# Patient Record
Sex: Female | Born: 1975 | Race: White | Hispanic: No | Marital: Married | Smoking: Never smoker
Health system: Southern US, Community
[De-identification: ages and names within clinical notes are randomized; demographics above are authoritative.]

## PROBLEM LIST (undated history)

## (undated) DIAGNOSIS — F419 Anxiety disorder, unspecified: Secondary | ICD-10-CM

## (undated) DIAGNOSIS — R55 Syncope and collapse: Secondary | ICD-10-CM

## (undated) HISTORY — PX: APPENDECTOMY: SHX54

## (undated) HISTORY — DX: Syncope and collapse: R55

## (undated) HISTORY — DX: Anxiety disorder, unspecified: F41.9

---
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
## 1998-08-05 ENCOUNTER — Other Ambulatory Visit: Admission: RE | Admit: 1998-08-05 | Discharge: 1998-08-05 | Payer: Self-pay | Admitting: Gynecology

## 1998-08-06 ENCOUNTER — Ambulatory Visit (HOSPITAL_COMMUNITY): Admission: RE | Admit: 1998-08-06 | Discharge: 1998-08-06 | Payer: Self-pay | Admitting: Dermatology

## 1998-11-22 ENCOUNTER — Other Ambulatory Visit: Admission: RE | Admit: 1998-11-22 | Discharge: 1998-11-22 | Payer: Self-pay | Admitting: Gynecology

## 1999-01-04 ENCOUNTER — Other Ambulatory Visit: Admission: RE | Admit: 1999-01-04 | Discharge: 1999-01-04 | Payer: Self-pay | Admitting: Gynecology

## 1999-10-03 ENCOUNTER — Other Ambulatory Visit: Admission: RE | Admit: 1999-10-03 | Discharge: 1999-10-03 | Payer: Self-pay | Admitting: Gynecology

## 2000-05-02 ENCOUNTER — Other Ambulatory Visit: Admission: RE | Admit: 2000-05-02 | Discharge: 2000-05-02 | Payer: Self-pay | Admitting: Gynecology

## 2000-05-04 ENCOUNTER — Ambulatory Visit (HOSPITAL_COMMUNITY): Admission: RE | Admit: 2000-05-04 | Discharge: 2000-05-04 | Payer: Self-pay | Admitting: Gastroenterology

## 2000-05-04 ENCOUNTER — Encounter: Payer: Self-pay | Admitting: Gastroenterology

## 2000-06-01 ENCOUNTER — Other Ambulatory Visit: Admission: RE | Admit: 2000-06-01 | Discharge: 2000-06-01 | Payer: Self-pay | Admitting: Gynecology

## 2001-07-09 ENCOUNTER — Other Ambulatory Visit: Admission: RE | Admit: 2001-07-09 | Discharge: 2001-07-09 | Payer: Self-pay | Admitting: Gynecology

## 2002-01-06 ENCOUNTER — Other Ambulatory Visit: Admission: RE | Admit: 2002-01-06 | Discharge: 2002-01-06 | Payer: Self-pay | Admitting: Gynecology

## 2002-07-10 ENCOUNTER — Other Ambulatory Visit: Admission: RE | Admit: 2002-07-10 | Discharge: 2002-07-10 | Payer: Self-pay | Admitting: Gynecology

## 2003-02-26 ENCOUNTER — Other Ambulatory Visit: Admission: RE | Admit: 2003-02-26 | Discharge: 2003-02-26 | Payer: Self-pay | Admitting: Gynecology

## 2003-06-10 ENCOUNTER — Ambulatory Visit (HOSPITAL_COMMUNITY): Admission: RE | Admit: 2003-06-10 | Discharge: 2003-06-10 | Payer: Self-pay | Admitting: Orthopedic Surgery

## 2003-06-10 ENCOUNTER — Encounter: Payer: Self-pay | Admitting: Orthopedic Surgery

## 2003-10-16 ENCOUNTER — Other Ambulatory Visit: Admission: RE | Admit: 2003-10-16 | Discharge: 2003-10-16 | Payer: Self-pay | Admitting: Gynecology

## 2004-12-16 ENCOUNTER — Other Ambulatory Visit: Admission: RE | Admit: 2004-12-16 | Discharge: 2004-12-16 | Payer: Self-pay | Admitting: Gynecology

## 2005-06-27 ENCOUNTER — Other Ambulatory Visit: Admission: RE | Admit: 2005-06-27 | Discharge: 2005-06-27 | Payer: Self-pay | Admitting: Gynecology

## 2005-12-26 ENCOUNTER — Other Ambulatory Visit: Admission: RE | Admit: 2005-12-26 | Discharge: 2005-12-26 | Payer: Self-pay | Admitting: Gynecology

## 2006-07-17 ENCOUNTER — Other Ambulatory Visit: Admission: RE | Admit: 2006-07-17 | Discharge: 2006-07-17 | Payer: Self-pay | Admitting: Gynecology

## 2007-01-01 ENCOUNTER — Other Ambulatory Visit: Admission: RE | Admit: 2007-01-01 | Discharge: 2007-01-01 | Payer: Self-pay | Admitting: Gynecology

## 2008-08-21 LAB — HM COLONOSCOPY: HM Colonoscopy: NORMAL

## 2009-08-21 LAB — HM MAMMOGRAPHY: HM Mammogram: NORMAL

## 2012-12-19 LAB — HM PAP SMEAR: HM Pap smear: NORMAL

## 2012-12-19 LAB — HM MAMMOGRAPHY: HM Mammogram: NORMAL

## 2013-11-14 ENCOUNTER — Telehealth: Payer: Self-pay

## 2013-11-14 NOTE — Telephone Encounter (Signed)
Left message for call back Identifiable   NEW CLIENT

## 2013-11-17 ENCOUNTER — Ambulatory Visit (INDEPENDENT_AMBULATORY_CARE_PROVIDER_SITE_OTHER): Payer: Commercial Managed Care - PPO | Admitting: Family Medicine

## 2013-11-17 ENCOUNTER — Encounter: Payer: Self-pay | Admitting: Family Medicine

## 2013-11-17 VITALS — BP 102/70 | HR 58 | Temp 98.2°F | Resp 16 | Ht 65.0 in | Wt 133.4 lb

## 2013-11-17 DIAGNOSIS — Z8041 Family history of malignant neoplasm of ovary: Secondary | ICD-10-CM | POA: Insufficient documentation

## 2013-11-17 DIAGNOSIS — F341 Dysthymic disorder: Secondary | ICD-10-CM

## 2013-11-17 DIAGNOSIS — I951 Orthostatic hypotension: Secondary | ICD-10-CM

## 2013-11-17 DIAGNOSIS — Z0000 Encounter for general adult medical examination without abnormal findings: Secondary | ICD-10-CM

## 2013-11-17 DIAGNOSIS — F419 Anxiety disorder, unspecified: Principal | ICD-10-CM

## 2013-11-17 DIAGNOSIS — J309 Allergic rhinitis, unspecified: Secondary | ICD-10-CM

## 2013-11-17 DIAGNOSIS — R61 Generalized hyperhidrosis: Secondary | ICD-10-CM

## 2013-11-17 DIAGNOSIS — F329 Major depressive disorder, single episode, unspecified: Secondary | ICD-10-CM

## 2013-11-17 LAB — LIPID PANEL
Cholesterol: 194 mg/dL (ref 0–200)
HDL: 77.6 mg/dL (ref >39.00)
LDL Cholesterol: 95 mg/dL (ref 0–99)
Total CHOL/HDL Ratio: 3
Triglycerides: 107 mg/dL (ref 0.0–149.0)
VLDL: 21.4 mg/dL (ref 0.0–40.0)

## 2013-11-17 LAB — HEPATIC FUNCTION PANEL
ALT: 19 U/L (ref 0–35)
AST: 20 U/L (ref 0–37)
Albumin: 3.9 g/dL (ref 3.5–5.2)
Alkaline Phosphatase: 42 U/L (ref 39–117)
BILIRUBIN TOTAL: 0.5 mg/dL (ref 0.3–1.2)
Bilirubin, Direct: 0.1 mg/dL (ref 0.0–0.3)
Total Protein: 7 g/dL (ref 6.0–8.3)

## 2013-11-17 LAB — BASIC METABOLIC PANEL
BUN: 13 mg/dL (ref 6–23)
CO2: 30 mEq/L (ref 19–32)
Calcium: 9.3 mg/dL (ref 8.4–10.5)
Chloride: 99 mEq/L (ref 96–112)
Creatinine, Ser: 0.9 mg/dL (ref 0.4–1.2)
GFR: 80.22 mL/min (ref >60.00)
Glucose, Bld: 84 mg/dL (ref 70–99)
Potassium: 3.9 mEq/L (ref 3.5–5.1)
Sodium: 135 mEq/L (ref 135–145)

## 2013-11-17 NOTE — Assessment & Plan Note (Signed)
New to provider.  Pt getting yearly paps w/ GYN.  Has had initial mammo.  Will follow closely.

## 2013-11-17 NOTE — Assessment & Plan Note (Signed)
New.  Pt's PE confirms her reported sxs.  Start OTC claritin or zyrtec daily.  Reviewed supportive care and red flags that should prompt return.  Pt expressed understanding and is in agreement w/ plan.

## 2013-11-17 NOTE — Assessment & Plan Note (Signed)
New.  Pt w/ long hx of similar.  Encouraged increased fluid intake and liberalization of salt.  Pt to try and focus on changing positions slowly- when possible.  Pt expressed understanding and is in agreement w/ plan.

## 2013-11-17 NOTE — Progress Notes (Signed)
   Subjective:    Patient ID: Kelly Stanley, female    DOB: 1976/02/05, 38 y.o.   MRN: [id number]  HPI New to establish.  Previous MD- none.  GYN- [facility], [facility]  Depression/Anxiety- chronic problem, on Zoloft.  sxs well controlled on $RemoveBefor'50mg'voFhRrvMVaKv$  daily.  Still having intermittent anxiety attacks.  Pt not interested in increasing dose.  FHx ovarian- mom, tested negative for BRCA.  UTD w/ yearly paps.  No family hx of breast cancer.  Allergic rhinitis- pt denies hx of seasonal allergies but recently relocated to area.  + nasal congestion, drainage.  Excessive sweating- pt reports excessive sweating, particularly at night.  Able to soak through 3 pairs of pajamas.  Has had similar 'for years'.  No relief from OCPs.    Orthostatic hypotension- pt has hx of low HR and low BP.  Reports that she will frequently get dizzy.  Particularly w/ position changes or exercise.  Has hx of fainting but not recently.  No nausea, vertigo.   Review of Systems For ROS see HPI     Objective:   Physical Exam  Vitals reviewed. Constitutional: She is oriented to person, place, and time. She appears well-developed and well-nourished. No distress.  HENT:  Head: Normocephalic and atraumatic.  Right Ear: Tympanic membrane normal.  Left Ear: Tympanic membrane normal.  Nose: Mucosal edema and rhinorrhea present. Right sinus exhibits no maxillary sinus tenderness and no frontal sinus tenderness. Left sinus exhibits no maxillary sinus tenderness and no frontal sinus tenderness.  Mouth/Throat: Mucous membranes are normal. Posterior oropharyngeal erythema (w/ PND) present.  Eyes: Conjunctivae and EOM are normal. Pupils are equal, round, and reactive to light.  Neck: Normal range of motion. Neck supple. No thyromegaly present.  Cardiovascular: Normal rate, regular rhythm and normal heart sounds.   Pulmonary/Chest: Effort normal and breath sounds normal. No respiratory distress. She has no wheezes. She  has no rales.  Musculoskeletal: She exhibits no edema.  Lymphadenopathy:    She has no cervical adenopathy.  Neurological: She is alert and oriented to person, place, and time.  Skin: Skin is warm and dry.  Psychiatric: She has a normal mood and affect. Her behavior is normal.          Assessment & Plan:

## 2013-11-17 NOTE — Patient Instructions (Signed)
Follow up in 1 year or as needed Keep up the good work!  You look great! We'll notify you of your lab results and make any changes if needed Start Claritin or Zyrtec daily- plain, NOT the D! Drink plenty of fluids, increase your salt intake, and change positions slowly to avoid dizziness Make sure you mention the night sweats to Dr Phineas Real  Call with any questions or concerns Welcome!  We're glad to have you!

## 2013-11-17 NOTE — Assessment & Plan Note (Signed)
New to provider, ongoing for pt.  Doing well on Zoloft.  Not interested in increasing dose at this time.  Will continue to follow.

## 2013-11-17 NOTE — Progress Notes (Signed)
Pre visit review using our clinic review tool, if applicable. No additional management support is needed unless otherwise documented below in the visit note. 

## 2013-11-17 NOTE — Assessment & Plan Note (Signed)
New to provider, pt reports this has been ongoing since HS.  Check labs to r/o infection, thyroid abnormality.  No relief w/ OCPs.  Recommended that pt also discuss this w/ GYN.  Will follow.

## 2013-11-18 LAB — CBC WITH DIFFERENTIAL/PLATELET
BASOS PCT: 0.5 % (ref 0.0–3.0)
Basophils Absolute: 0 10*3/uL (ref 0.0–0.1)
EOS ABS: 0.2 10*3/uL (ref 0.0–0.7)
EOS PCT: 2.6 % (ref 0.0–5.0)
HEMATOCRIT: 41.8 % (ref 36.0–46.0)
Hemoglobin: 14.1 g/dL (ref 12.0–15.0)
LYMPHS ABS: 2.8 10*3/uL (ref 0.7–4.0)
Lymphocytes Relative: 39.9 % (ref 12.0–46.0)
MCHC: 33.6 g/dL (ref 30.0–36.0)
MCV: 93.7 fl (ref 78.0–100.0)
Monocytes Absolute: 0.4 10*3/uL (ref 0.1–1.0)
Monocytes Relative: 5.6 % (ref 3.0–12.0)
NEUTROS ABS: 3.6 10*3/uL (ref 1.4–7.7)
Neutrophils Relative %: 51.4 % (ref 43.0–77.0)
Platelets: 225 10*3/uL (ref 150.0–400.0)
RBC: 4.47 Mil/uL (ref 3.87–5.11)
RDW: 12.3 % (ref 11.5–14.6)
WBC: 6.9 10*3/uL (ref 4.5–10.5)

## 2013-11-19 ENCOUNTER — Encounter: Payer: Self-pay | Admitting: General Practice

## 2013-11-19 NOTE — Telephone Encounter (Signed)
Unable to reach pre visit.  

## 2013-11-22 LAB — VITAMIN D 1,25 DIHYDROXY
VITAMIN D3 1, 25 (OH): 62 pg/mL
Vitamin D 1, 25 (OH)2 Total: 62 pg/mL (ref 18–72)
Vitamin D2 1, 25 (OH)2: <8 pg/mL

## 2013-11-24 ENCOUNTER — Encounter: Payer: Self-pay | Admitting: General Practice

## 2013-12-24 ENCOUNTER — Ambulatory Visit (INDEPENDENT_AMBULATORY_CARE_PROVIDER_SITE_OTHER): Payer: Commercial Managed Care - PPO | Admitting: Gynecology

## 2013-12-24 ENCOUNTER — Encounter: Payer: Self-pay | Admitting: Gynecology

## 2013-12-24 ENCOUNTER — Other Ambulatory Visit (HOSPITAL_COMMUNITY)
Admission: RE | Admit: 2013-12-24 | Discharge: 2013-12-24 | Disposition: A | Discharge status: Home / Self Care | Payer: Commercial Managed Care - PPO | Source: Ambulatory Visit | Attending: Gynecology | Admitting: Gynecology

## 2013-12-24 VITALS — BP 120/70 | Ht 65.0 in | Wt 133.0 lb

## 2013-12-24 DIAGNOSIS — Z01419 Encounter for gynecological examination (general) (routine) without abnormal findings: Secondary | ICD-10-CM

## 2013-12-24 DIAGNOSIS — Z309 Encounter for contraceptive management, unspecified: Secondary | ICD-10-CM

## 2013-12-24 DIAGNOSIS — Z1151 Encounter for screening for human papillomavirus (HPV): Secondary | ICD-10-CM | POA: Insufficient documentation

## 2013-12-24 MED ORDER — DESOGESTREL-ETHINYL ESTRADIOL 0.15-0.02/0.01 MG (21/5) PO TABS: 1.0000 | ORAL_TABLET | Freq: Every day | ORAL | Status: DC

## 2013-12-24 NOTE — Patient Instructions (Signed)
Continue on the birth control pills as we discussed. Followup if any issues otherwise in one year.

## 2013-12-24 NOTE — Addendum Note (Signed)
Addended by: Nelva Nay on: 12/24/2013 10:53 AM   Modules accepted: Orders

## 2013-12-24 NOTE — Progress Notes (Signed)
Kelly Stanley [birth year] [id number]        38 y.o.  [id number] for annual exam.  Prior patient to moved out of the area and now returns. No problems. Several issues reviewed below.  Past medical history,surgical history, problem list, medications, allergies, family history and social history were all reviewed and documented as reviewed in the EPIC chart.  ROS:  12 system ROS performed with pertinent positives and negatives included in the history, assessment and plan.  Included Systems: General, HEENT, Neck, Cardiovascular, Pulmonary, Gastrointestinal, Genitourinary, Musculoskeletal, Dermatologic, Endocrine, Hematological, Neurologic, Psychiatric Additional significant findings :  None   Exam: Kim assistant Filed Vitals:   12/24/13 0957  BP: 120/70  Height: _0  (1.651 m)  Weight: 133 lb (60.328 kg)   General appearance:  Normal affect, orientation and appearance. Skin: Grossly normal HEENT: Without gross lesions.  No cervical or supraclavicular adenopathy. Thyroid normal.  Lungs:  Clear without wheezing, rales or rhonchi Cardiac: RR, without RMG Abdominal:  Soft, nontender, without masses, guarding, rebound, organomegaly or hernia Breasts:  Examined lying and sitting without masses, retractions, discharge or axillary adenopathy. Pelvic:  Ext/BUS/vagina normal  Cervix normal. Pap/HPV  Uterus anteverted, normal size, shape and contour, midline and mobile nontender   Adnexa  Without masses or tenderness    Anus and perineum  Normal   Rectovaginal  Normal sphincter tone without palpated masses or tenderness.    Assessment/Plan:  38 y.o. [name] female for annual exam with regular menses, oral contraceptives.   1. Contraceptive management. Patient on desogestrel low dose pill. Doing well. Reviewed risks of birth control pills to include increased risk of stroke heart attack DVT. Possible slight increased risk with desogestrel. Maternal history of ovarian cancer. Benefits of risk  reduction continuing on the oral contraceptives discussed. Patient wants to continue for now and I refilled her x1 year. 2. 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 Passed away last year. Was tested and negative for BRCA genes. No other family members with ovarian cancer, breast cancer, colon or uterine cancer. Patient had previously been doing ultrasounds and CA 125's. Had stopped doing these at her other gynecologist recognizing questionable benefit. Patient not interested in pursuing at this time. 3. Anxiety. Patient has been on Zoloft 50 mg by her other physician for some anxiety. She does well with this and wants continued. She has refills at home but she will call when she needs refills and I feel comfortable with this. 4. Pap smear 2014. Pap/HPV today. History of LGSIL changes 2005 through 2007. Colposcopy was normal at that time. Negative Pap smears since then. Options for less frequent screening intervals reviewed and will readdress on an annual basis. 5. Mammography. Patient did have mammogram 2014 secondary to questionable palpable area historically. Mammogram was normal. Exam today is normal patient has no self-reported abnormalities. Will repeat mammogram at age 22. SBE monthly review. 6. Health maintenance. Patient recently had a full blood work through Dr. Birdie Riddle office. Followup in one year, sooner as needed.   Note: This document was prepared with digital dictation and possible smart phrase technology. Any transcriptional errors that result from this process are unintentional.   [name] MD, 10:42 AM 12/24/2013

## 2013-12-26 ENCOUNTER — Encounter: Payer: Self-pay | Admitting: Gynecology

## 2014-01-06 ENCOUNTER — Ambulatory Visit: Payer: Commercial Managed Care - PPO | Admitting: Internal Medicine

## 2014-01-13 ENCOUNTER — Ambulatory Visit (INDEPENDENT_AMBULATORY_CARE_PROVIDER_SITE_OTHER): Payer: Commercial Managed Care - PPO | Admitting: Internal Medicine

## 2014-01-13 ENCOUNTER — Encounter: Payer: Self-pay | Admitting: Internal Medicine

## 2014-01-13 VITALS — BP 95/57 | HR 71 | Temp 97.8°F | Wt 132.0 lb

## 2014-01-13 DIAGNOSIS — L259 Unspecified contact dermatitis, unspecified cause: Secondary | ICD-10-CM

## 2014-01-13 MED ORDER — PREDNISONE 10 MG PO TABS: ORAL_TABLET | ORAL | Status: DC

## 2014-01-13 MED ORDER — TRIAMCINOLONE ACETONIDE 0.1 % EX LOTN: 1.0000 "application " | TOPICAL_LOTION | Freq: Three times a day (TID) | CUTANEOUS | Status: DC

## 2014-01-13 NOTE — Patient Instructions (Signed)
Take prednisone as prescribed for few days Use the lotion twice or 3 times a day in the chest and arms Claritin 10 mg OTC one tablet daily until better If not improving or you get worse let us know

## 2014-01-13 NOTE — Progress Notes (Signed)
   Subjective:    Patient ID: Kelly Stanley, female    DOB: 20-May-1976, 38 y.o.   MRN: [id number]  DOS:  01/13/2014 Type of  Visit: acute History: About 10 days ago she did yard work, shortly after developed a rash in the legs, it was very itchy, went to urgent care and was prescribed a cortisone cream which is not helping much. Also, 3 days ago developed a different rash in the upper chest and inner arms.   ROS Denies fever chills No joint aches. Occasional headaches, mild, nothing new to her  Past Medical History  Diagnosis Date  . Syncope     Past Surgical History  Procedure Laterality Date  . Knee arthroscopy w/ lateral release Left 1997  . Appendectomy      History   Social History  . Marital Status: Married    Spouse Name: N/A    Number of Children: N/A  . Years of Education: N/A   Occupational History  . Not on file.   Social History Main Topics  . Smoking status: Never Smoker   . Smokeless tobacco: Never Used  . Alcohol Use: Yes     Comment: Occas  . Drug Use: No  . Sexual Activity: Yes    Birth Control/ Protection: Pill   Other Topics Concern  . Not on file   Social History Narrative  . No narrative on file        Medication List       This list is accurate as of: 01/13/14  5:35 PM.  Always use your most recent med list.               desogestrel-ethinyl estradiol 0.15-0.02/0.01 MG (21/5) tablet  Commonly known as:  AZURETTE  Take 1 tablet by mouth daily.     predniSONE 10 MG tablet  Commonly known as:  DELTASONE  4 tablets x 2 days, 3 tabs x 2 days, 2 tabs x 2 days, 1 tab x 2 days     sertraline 50 MG tablet  Commonly known as:  ZOLOFT  Take 50 mg by mouth daily.     triamcinolone lotion 0.1 %  Commonly known as:  KENALOG  Apply 1 application topically 3 (three) times daily.           Objective:   Physical Exam BP 95/57  Pulse 71  Temp(Src) 97.8 F (36.6 C)  Wt 132 lb (59.875 kg)  SpO2 100%  LMP 12/10/2013 General --  alert, well-developed, NAD.   Extremities-- no pretibial edema bilaterally ;   skin lesions several days old, some in a linear disposition. She also has a rash on the upper chest and inner arms bilaterally, the rash looks different: multiple 2-3 mm papules, slightly red, non-confluent. Neurologic--  alert & oriented X3. Speech normal, gait normal, strength normal in all extremities.  Psych-- Cognition and judgment appear intact. Cooperative with normal attention span and concentration. No anxious or depressed appearing.        Assessment & Plan:   Contact dermatitis, Rash on the extremities quite consistent with contact dermatitis but I'm not sure about a rash at the chest and arms. She has no systemic symptoms. Plan: Prednisone, Claritin, Kenalog lotion. Information about contact dermatitis provided, recommend to wash all her clothing and watch for pet going  to the yard. Call if no better

## 2014-01-13 NOTE — Progress Notes (Signed)
Pre visit review using our clinic review tool, if applicable. No additional management support is needed unless otherwise documented below in the visit note. 

## 2014-01-26 ENCOUNTER — Encounter: Payer: Self-pay | Admitting: Physician Assistant

## 2014-01-26 ENCOUNTER — Ambulatory Visit (INDEPENDENT_AMBULATORY_CARE_PROVIDER_SITE_OTHER): Payer: Commercial Managed Care - PPO | Admitting: Physician Assistant

## 2014-01-26 VITALS — BP 124/72 | HR 73 | Temp 98.7°F | Resp 16 | Ht 65.0 in | Wt 134.2 lb

## 2014-01-26 DIAGNOSIS — R42 Dizziness and giddiness: Secondary | ICD-10-CM

## 2014-01-26 DIAGNOSIS — R141 Gas pain: Secondary | ICD-10-CM

## 2014-01-26 DIAGNOSIS — L239 Allergic contact dermatitis, unspecified cause: Secondary | ICD-10-CM

## 2014-01-26 DIAGNOSIS — R140 Abdominal distension (gaseous): Secondary | ICD-10-CM

## 2014-01-26 DIAGNOSIS — R143 Flatulence: Secondary | ICD-10-CM

## 2014-01-26 DIAGNOSIS — R142 Eructation: Secondary | ICD-10-CM

## 2014-01-26 DIAGNOSIS — L259 Unspecified contact dermatitis, unspecified cause: Secondary | ICD-10-CM

## 2014-01-26 LAB — POCT URINE PREGNANCY: Preg Test, Ur: NEGATIVE

## 2014-01-26 MED ORDER — SERTRALINE HCL 50 MG PO TABS: 50.0000 mg | ORAL_TABLET | Freq: Every day | ORAL | Status: DC

## 2014-01-26 MED ORDER — PREDNISONE 10 MG PO TABS: ORAL_TABLET | ORAL | Status: DC

## 2014-01-26 NOTE — Assessment & Plan Note (Signed)
Feel lightheadedness is a manifestation of increased anxiety from constant itch and scratching.  OVS were negative. Zoloft refilled as patient has been out of medication.

## 2014-01-26 NOTE — Progress Notes (Signed)
Pre visit review using our clinic review tool, if applicable. No additional management support is needed unless otherwise documented below in the visit note/SLS  

## 2014-01-26 NOTE — Patient Instructions (Addendum)
Please take prednisone as directed -- 4 tablets by mouth for 3 days, 3 tablets by mouth for 3 days, 2 tablets by mouth for 3 days, then 1 tablet for 3 days.  Apply Sarna lotion for itch.  Continue claritin.  Cool compresses will be beneficial.  Avoid sunlight and heat as this will worsen rash.  Stop using soaps/lotions/detergents that are dyed or heavily scented. Please call if symptoms are not improving. For lightheadedness, I think this is being exacerbated by anxiety/frustration from your itching.  Try to continue staying well-hydrated.  Add 1 gatorade per day.  Continue healthy snacking.  If symptoms persist despite resolution of itchy rash, please let us know.

## 2014-01-26 NOTE — Progress Notes (Signed)
Patient presents to clinic today c/o 3 weeks of a pruritic rash of her chest, torso and bilateral upper extremities. Patient also has a different rash 4 weeks ago that was diagnosed as rhus dermatitis.  Patient given 8-day taper of steroids.  States poison ivy rash completely resolved.  The newer rash in question started resolving and was almost completely resolved while on steroid.  After cessation of steroid, rash came back and is worse.  Patient denies change in detergent, lotion or other hygiene products.  Does have pets in the home.  Denies hx of metal allergy.  Denies SOB, wheezing or facial swelling.  Does have a history of PUPS with previous pregnancy.  LMP 3 weeks ago.  Patient requesting pregnancy test.  Patient also endorses continued lightheadedness.  Denies syncope.  Patient with documented orthostatic hypotension.  Endorses pretty good fluid intake, but states she "could do much better".  Recent labs are negative for anemia.  Past Medical History  Diagnosis Date  . Syncope     Current Outpatient Prescriptions on File Prior to Visit  Medication Sig Dispense Refill  . desogestrel-ethinyl estradiol (AZURETTE) 0.15-0.02/0.01 MG (21/5) tablet Take 1 tablet by mouth daily.  1 Package  12  . triamcinolone lotion (KENALOG) 0.1 % Apply 1 application topically 3 (three) times daily.  60 mL  0   No current facility-administered medications on file prior to visit.    Allergies  Allergen Reactions  . Codeine Nausea And Vomiting    Family History  Problem Relation Age of Onset  . Cancer Mother     ovarian  . Stroke Maternal Grandfather   . Diabetes Maternal Grandfather     History   Social History  . Marital Status: Married    Spouse Name: N/A    Number of Children: N/A  . Years of Education: N/A   Social History Main Topics  . Smoking status: Never Smoker   . Smokeless tobacco: Never Used  . Alcohol Use: Yes     Comment: Occas  . Drug Use: No  . Sexual Activity: Yes   Birth Control/ Protection: Pill   Other Topics Concern  . None   Social History Narrative  . None   Review of Systems - See HPI.  All other ROS are negative.  BP 124/72  Pulse 73  Temp(Src) 98.7 F (37.1 C) (Oral)  Resp 16  Ht 5\' 5"  (1.651 m)  Wt 134 lb 4 oz (60.895 kg)  BMI 22.34 kg/m2  SpO2 98%  LMP 01/02/2014  Physical Exam  Vitals reviewed. Constitutional: She is oriented to person, place, and time and well-developed, well-nourished, and in no distress.  HENT:  Head: Normocephalic and atraumatic.  Eyes: Conjunctivae are normal. Pupils are equal, round, and reactive to light.  Neck: Neck supple.  Cardiovascular: Normal rate, regular rhythm, normal heart sounds and intact distal pulses.   Pulmonary/Chest: Effort normal and breath sounds normal. No respiratory distress. She has no wheezes. She has no rales. She exhibits no tenderness.  Neurological: She is alert and oriented to person, place, and time.  Skin: Skin is warm and dry.  Scattered macular rash of torso and bilateral upper extremities.  Psychiatric: Affect normal.   Recent Results (from the past 2160 hour(s))  LIPID PANEL     Status: None   Collection Time    11/17/13  9:17 AM      Result Value Ref Range   Cholesterol 194  0 - 200 mg/dL   Comment: ATP  III Classification       Desirable:  < 200 mg/dL               Borderline High:  200 - 239 mg/dL          High:  > = 240 mg/dL   Triglycerides 107.0  0.0 - 149.0 mg/dL   Comment: Normal:  <150 mg/dLBorderline High:  150 - 199 mg/dL   HDL 77.60  >39.00 mg/dL   VLDL 21.4  0.0 - 40.0 mg/dL   LDL Cholesterol 95  0 - 99 mg/dL   Total CHOL/HDL Ratio 3     Comment:                Men          Women1/2 Average Risk     3.4          3.3Average Risk          5.0          4.42X Average Risk          9.6          7.13X Average Risk          15.0          11.0                      BASIC METABOLIC PANEL     Status: None   Collection Time    11/17/13  9:17 AM      Result  Value Ref Range   Sodium 135  135 - 145 mEq/L   Potassium 3.9  3.5 - 5.1 mEq/L   Chloride 99  96 - 112 mEq/L   CO2 30  19 - 32 mEq/L   Glucose, Bld 84  70 - 99 mg/dL   BUN 13  6 - 23 mg/dL   Creatinine, Ser 0.9  0.4 - 1.2 mg/dL   Calcium 9.3  8.4 - 10.5 mg/dL   GFR 80.22  >60.00 mL/min  HEPATIC FUNCTION PANEL     Status: None   Collection Time    11/17/13  9:17 AM      Result Value Ref Range   Total Bilirubin 0.5  0.3 - 1.2 mg/dL   Bilirubin, Direct 0.1  0.0 - 0.3 mg/dL   Alkaline Phosphatase 42  39 - 117 U/L   AST 20  0 - 37 U/L   ALT 19  0 - 35 U/L   Total Protein 7.0  6.0 - 8.3 g/dL   Albumin 3.9  3.5 - 5.2 g/dL  CBC WITH DIFFERENTIAL     Status: None   Collection Time    11/17/13  9:17 AM      Result Value Ref Range   WBC 6.9  4.5 - 10.5 K/uL   RBC 4.47  3.87 - 5.11 Mil/uL   Hemoglobin 14.1  12.0 - 15.0 g/dL   HCT 41.8  36.0 - 46.0 %   MCV 93.7  78.0 - 100.0 fl   MCHC 33.6  30.0 - 36.0 g/dL   RDW 12.3  11.5 - 14.6 %   Platelets 225.0  150.0 - 400.0 K/uL   Neutrophils Relative % 51.4  43.0 - 77.0 %   Lymphocytes Relative 39.9  12.0 - 46.0 %   Monocytes Relative 5.6  3.0 - 12.0 %   Eosinophils Relative 2.6  0.0 - 5.0 %   Basophils Relative 0.5  0.0 - 3.0 %  Neutro Abs 3.6  1.4 - 7.7 K/uL   Lymphs Abs 2.8  0.7 - 4.0 K/uL   Monocytes Absolute 0.4  0.1 - 1.0 K/uL   Eosinophils Absolute 0.2  0.0 - 0.7 K/uL   Basophils Absolute 0.0  0.0 - 0.1 K/uL  VITAMIN D 1,25 DIHYDROXY     Status: None   Collection Time    11/17/13  9:17 AM      Result Value Ref Range   Vitamin D 1, 25 (OH)2 Total 62  18 - 72 pg/mL   Vitamin D3 1, 25 (OH)2 62     Vitamin D2 1, 25 (OH)2 <8     Comment: Vitamin D3, 1,25(OH)2 indicates both endogenous     production and supplementation.  Vitamin D2, 1,25(OH)2     is an indicator of exogeous sources, such as diet or     supplementation.  Interpretation and therapy are based     on measurement of Vitamin D,1,25(OH)2, Total.     This test was  developed and its performance     characteristics have been determined by [facility], [place], [place].     Performance characteristics refer to the     analytical performance of the test.  POCT URINE PREGNANCY     Status: None   Collection Time    01/26/14  3:09 PM      Result Value Ref Range   Preg Test, Ur Negative     Assessment/Plan: Allergic contact dermatitis + Dermatographism. Urine pregnancy negative so no PUPS.Likely rebound rash from too short of steroid course. Rx 12-day prednisone taper.  Sarna lotion.  Continue claritin.  Cool compresses.  Feel lightheadedness is a manifestation of increased anxiety from constant itch and scratching.    Lightheaded Feel lightheadedness is a manifestation of increased anxiety from constant itch and scratching.  OVS were negative. Zoloft refilled as patient has been out of medication.

## 2014-01-26 NOTE — Assessment & Plan Note (Addendum)
+   Dermatographism. Urine pregnancy negative so no PUPS.Likely rebound rash from too short of steroid course. Rx 12-day prednisone taper.  Sarna lotion.  Continue claritin.  Cool compresses.  Feel lightheadedness is a manifestation of increased anxiety from constant itch and scratching.

## 2014-04-09 ENCOUNTER — Other Ambulatory Visit: Payer: Self-pay | Admitting: Family Medicine

## 2014-04-09 NOTE — Telephone Encounter (Signed)
Med filled.  

## 2014-05-13 ENCOUNTER — Ambulatory Visit (INDEPENDENT_AMBULATORY_CARE_PROVIDER_SITE_OTHER): Payer: Commercial Managed Care - PPO | Admitting: Family Medicine

## 2014-05-13 ENCOUNTER — Encounter: Payer: Self-pay | Admitting: Family Medicine

## 2014-05-13 VITALS — BP 120/78 | HR 53 | Temp 98.0°F | Resp 16 | Wt 134.0 lb

## 2014-05-13 DIAGNOSIS — R21 Rash and other nonspecific skin eruption: Secondary | ICD-10-CM | POA: Insufficient documentation

## 2014-05-13 MED ORDER — PREDNISONE 10 MG PO TABS: ORAL_TABLET | ORAL | Status: DC

## 2014-05-13 MED ORDER — PERMETHRIN 5 % EX CREA: 1.0000 "application " | TOPICAL_CREAM | Freq: Once | CUTANEOUS | Status: DC

## 2014-05-13 NOTE — Progress Notes (Signed)
Pre visit review using our clinic review tool, if applicable. No additional management support is needed unless otherwise documented below in the visit note. 

## 2014-05-13 NOTE — Progress Notes (Signed)
   Subjective:    Patient ID: Kelly Stanley, female    DOB: September 14, 1975, 38 y.o.   MRN: [id number]  HPI Rash- sxs started 1 week ago.  No one at home w/ itchy rash, including husband who has no rash.  Around kids and pets regularly.  sxs started on R inner thigh and spread to L thigh and L arm.  Nothing on hands or feet.  Very itchy.  Attempted to use Triamcinolone lotion w/ minimal relief.  Some improvement w/ Benadryl gel.   Review of Systems For ROS see HPI     Objective:   Physical Exam  Vitals reviewed. Constitutional: She is oriented to person, place, and time. She appears well-developed and well-nourished. No distress.  Neurological: She is alert and oriented to person, place, and time.  Skin: Skin is warm and dry. Rash (pt w/ scabbed vesicular rash on bilateral inner thighs and L forearm w/ some apparant burrow formations) noted.          Assessment & Plan:

## 2014-05-13 NOTE — Patient Instructions (Signed)
Follow up as needed Apply the Elimite cream tonight from jaw down, covering all exposed areas, sleep in it, wash it off in AM and then wash all sheets and towels Call with any questions or concerns- particularly if not improving Hang in there!

## 2014-05-13 NOTE — Assessment & Plan Note (Signed)
New.  Pt's rash is almost a cross between contact dermatitis and scabies.  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  Start Pred taper to cover for possible contact dermatitis and to improve itching.  Reviewed supportive care and red flags that should prompt return.  Pt expressed understanding and is in agreement w/ plan.

## 2014-06-03 ENCOUNTER — Encounter: Payer: Self-pay | Admitting: Internal Medicine

## 2014-06-03 ENCOUNTER — Ambulatory Visit (INDEPENDENT_AMBULATORY_CARE_PROVIDER_SITE_OTHER): Payer: Commercial Managed Care - PPO | Admitting: Internal Medicine

## 2014-06-03 VITALS — BP 110/72 | HR 48 | Temp 98.4°F | Wt 133.5 lb

## 2014-06-03 DIAGNOSIS — J209 Acute bronchitis, unspecified: Secondary | ICD-10-CM

## 2014-06-03 MED ORDER — AZITHROMYCIN 250 MG PO TABS: ORAL_TABLET | ORAL | Status: DC

## 2014-06-03 NOTE — Patient Instructions (Signed)
Rest, fluids , tylenol If  cough, take Mucinex DM twice a day as needed  If nasal  congestion use OTC Nasocort: 2 nasal sprays on each side of the nose daily until you feel better  Take the antibiotic as prescribed  (zpack); your birth control pills won't be as effective while on antibiotics  Call if not gradually better over the next  10 days Call anytime if the symptoms are severe

## 2014-06-03 NOTE — Progress Notes (Signed)
Pre visit review using our clinic review tool, if applicable. No additional management support is needed unless otherwise documented below in the visit note. 

## 2014-06-03 NOTE — Progress Notes (Signed)
   Subjective:    Patient ID: Kelly Stanley, female    DOB: 06/20/1976, 38 y.o.   MRN: [id number]  DOS:  06/03/2014 Type of visit - description : acute Interval history: Symptoms started 2 weeks ago with increasingly more frequent and intense cough with mild greenish sputum production. She took Mucinex as needed with some help. Has front of headaches, sometimes has a hard time controlling the cough. Other family members are affected as well   ROS Denies actual fever or chills Mild ear ache bilaterally for 2 days, mild sinus pain. Chest pain only with cough No nausea, vomiting, diarrhea  Past Medical History  Diagnosis Date  . Syncope     Past Surgical History  Procedure Laterality Date  . Knee arthroscopy w/ lateral release Left 1997  . Appendectomy      History   Social History  . Marital Status: Married    Spouse Name: N/A    Number of Children: N/A  . Years of Education: N/A   Occupational History  . Not on file.   Social History Main Topics  . Smoking status: Never Smoker   . Smokeless tobacco: Never Used  . Alcohol Use: Yes     Comment: Occas  . Drug Use: No  . Sexual Activity: Yes    Birth Control/ Protection: Pill   Other Topics Concern  . Not on file   Social History Narrative  . No narrative on file        Medication List       This list is accurate as of: 06/03/14  7:04 PM.  Always use your most recent med list.               azithromycin 250 MG tablet  Commonly known as:  ZITHROMAX Z-PAK  2 tabs a day the first day, then 1 tab a day x 4 days     desogestrel-ethinyl estradiol 0.15-0.02/0.01 MG (21/5) tablet  Commonly known as:  AZURETTE  Take 1 tablet by mouth daily.     sertraline 50 MG tablet  Commonly known as:  ZOLOFT  TAKE 1 TABLET BY MOUTH DAILY     triamcinolone lotion 0.1 %  Commonly known as:  KENALOG  Apply 1 application topically 3 (three) times daily.           Objective:   Physical Exam BP 110/72  Pulse  48  Temp(Src) 98.4 F (36.9 C) (Oral)  Wt 133 lb 8 oz (60.555 kg)  SpO2 98%  LMP 05/31/2014  General -- alert, well-developed, NAD.  HEENT-- Not pale.  R and L  Ear--slt bulge TM w/o redness  Throat symmetric, no redness or discharge. Face symmetric, sinuses not tender to palpation. Nose   congested. Lungs -- normal respiratory effort, no intercostal retractions, no accessory muscle use, and ronchi W/ cough only Heart-- normal rate, regular rhythm, no murmur.  Psych-- Cognition and judgment appear intact. Cooperative with normal attention span and concentration. No anxious or depressed appearing.      Assessment & Plan:    Bronchitis-- see instructions

## 2014-06-05 ENCOUNTER — Other Ambulatory Visit: Payer: Self-pay

## 2014-06-22 ENCOUNTER — Encounter: Payer: Self-pay | Admitting: Internal Medicine

## 2014-09-25 ENCOUNTER — Other Ambulatory Visit: Payer: Self-pay | Admitting: Family Medicine

## 2014-09-25 NOTE — Telephone Encounter (Signed)
Last ov 05/13/14 zoloft last filled 04/09/14 #30 with 3

## 2014-10-02 ENCOUNTER — Ambulatory Visit (INDEPENDENT_AMBULATORY_CARE_PROVIDER_SITE_OTHER): Payer: Commercial Managed Care - PPO | Admitting: Physician Assistant

## 2014-10-02 ENCOUNTER — Encounter: Payer: Self-pay | Admitting: Physician Assistant

## 2014-10-02 VITALS — BP 118/68 | HR 75 | Temp 98.4°F | Resp 16 | Ht 65.0 in | Wt 133.4 lb

## 2014-10-02 DIAGNOSIS — H6503 Acute serous otitis media, bilateral: Secondary | ICD-10-CM | POA: Diagnosis not present

## 2014-10-02 DIAGNOSIS — F0781 Postconcussional syndrome: Secondary | ICD-10-CM | POA: Insufficient documentation

## 2014-10-02 DIAGNOSIS — H6500 Acute serous otitis media, unspecified ear: Secondary | ICD-10-CM | POA: Insufficient documentation

## 2014-10-02 DIAGNOSIS — N926 Irregular menstruation, unspecified: Secondary | ICD-10-CM

## 2014-10-02 LAB — POCT URINE PREGNANCY: PREG TEST UR: NEGATIVE

## 2014-10-02 MED ORDER — FLUTICASONE PROPIONATE 50 MCG/ACT NA SUSP: 2.0000 | Freq: Every day | NASAL | Status: DC

## 2014-10-02 NOTE — Addendum Note (Signed)
Addended by: Rockwell Germany on: 10/02/2014 05:17 PM   Modules accepted: Orders

## 2014-10-02 NOTE — Assessment & Plan Note (Signed)
Neuro exam including Concussion assessment unremarkable. Symptoms related to post-concussion syndrome which patient is more prone to giving her history of concussions throughout the years. Supportive measures discussed. Avoid any overexertion.  Sleep will be very beneficial.  Discussed mental rest.  Follow-up in 1 week.

## 2014-10-02 NOTE — Assessment & Plan Note (Signed)
Do think this is contributing to the intermittent vertigo as there is a good amount of fluid present.  Rx Flonase.  Take a daily Claritin.

## 2014-10-02 NOTE — Progress Notes (Signed)
Patient presents to clinic today c/o severe headaches, lightheadedness and nausea after being involved in a head-head collision while playing soccer this past Saturday.  Denies LOC at time of incident.  Symptoms began two days ago, 4 days after the incident.  Denies sinus pressure, sinus pain, ear pressure or URI symptoms.  Endorses prior history of concussion in college.  Endorses lightheadedness is improving, but headaches still present.  Has been resting over the week.  Past Medical History  Diagnosis Date  . Syncope     Current Outpatient Prescriptions on File Prior to Visit  Medication Sig Dispense Refill  . desogestrel-ethinyl estradiol (AZURETTE) 0.15-0.02/0.01 MG (21/5) tablet Take 1 tablet by mouth daily. 1 Package 12  . sertraline (ZOLOFT) 50 MG tablet TAKE 1 TABLET BY MOUTH EVERY DAY 30 tablet 6   No current facility-administered medications on file prior to visit.    Allergies  Allergen Reactions  . Codeine Nausea And Vomiting    Family History  Problem Relation Age of Onset  . Cancer Mother     ovarian  . Stroke Maternal Grandfather   . Diabetes Maternal Grandfather     History   Social History  . Marital Status: Married    Spouse Name: N/A  . Number of Children: N/A  . Years of Education: N/A   Social History Main Topics  . Smoking status: Never Smoker   . Smokeless tobacco: Never Used  . Alcohol Use: Yes     Comment: Occas  . Drug Use: No  . Sexual Activity: Yes    Birth Control/ Protection: Pill   Other Topics Concern  . None   Social History Narrative   Review of Systems - See HPI.  All other ROS are negative.  BP 118/68 mmHg  Pulse 75  Temp(Src) 98.4 F (36.9 C) (Oral)  Resp 16  Ht 5\' 5"  (1.651 m)  Wt 133 lb 6 oz (60.499 kg)  BMI 22.19 kg/m2  SpO2 99%  LMP 10/02/2014  Physical Exam  Constitutional: She is oriented to person, place, and time and well-developed, well-nourished, and in no distress.  HENT:  Head: Normocephalic and  atraumatic.  Right Ear: External ear normal.  Left Ear: External ear normal.  Nose: Nose normal.  Mouth/Throat: Oropharynx is clear and moist. No oropharyngeal exudate.  Fluid noted behind TM bilaterally.  Eyes: Conjunctivae and EOM are normal. Pupils are equal, round, and reactive to light.  Neck: Normal range of motion. Neck supple.  Cardiovascular: Normal rate, regular rhythm, normal heart sounds and intact distal pulses.   Pulmonary/Chest: Effort normal and breath sounds normal. No respiratory distress. She has no wheezes. She has no rales. She exhibits no tenderness.  Lymphadenopathy:    She has no cervical adenopathy.  Neurological: She is alert and oriented to person, place, and time. She has normal reflexes. No cranial nerve deficit. Gait normal. Coordination normal. GCS score is 15.  Skin: Skin is warm and dry. No rash noted.  Psychiatric: Affect normal.  Vitals reviewed.    Assessment/Plan: Acute serous otitis media Do think this is contributing to the intermittent vertigo as there is a good amount of fluid present.  Rx Flonase.  Take a daily Claritin.   Post concussion syndrome Neuro exam including Concussion assessment unremarkable. Symptoms related to post-concussion syndrome which patient is more prone to giving her history of concussions throughout the years. Supportive measures discussed. Avoid any overexertion.  Sleep will be very beneficial.  Discussed mental rest.  Follow-up in  1 week.

## 2014-10-02 NOTE — Patient Instructions (Addendum)
Your exam checks out great.  I want you to stay well-hydrated.  Add a gatorade to your daily regimen. Get plenty of rest.  Symptoms should begin to improve.  I actually think the dizziness is stemming from the fluid behind your ear drums.  Use the Flonase daily as directed and take a daily Claritin.   Follow-up in 1 week.   Post-Concussion Syndrome Post-concussion syndrome means you have problems after a head injury. The problems can last for weeks or months. The problems usually go away on their own over time.  HOME CARE   Only take medicines as told by your doctor. Do not take aspirin.  Sleep with your head raised (elevated) to help with headaches.  Avoid activities that can cause another head injury.  Do not play contact sports like football, hockey, soccer or basketball. Do not do other risky activities like downhill skiing or martial arts, or ride horses until your doctor says it is OK.  Keep all doctor visits as told. GET HELP RIGHT AWAY IF:  You feel confused or very sleepy.  You cannot wake the injured person.  You feel sick to your stomach (nauseous) or keep throwing up (vomiting).  You feel like you are moving when you are not (vertigo).  You notice the injured person's eyes moving back and forth very fast.  You start shaking (convulsing) or pass out (faint).  You have very bad headaches that do not get better with medicine.  You cannot use your arms or legs normally.  The black centers of your eyes (pupils) change size.  You have clear or bloody fluid coming from your nose or ears.  Your problems get worse, not better. MAKE SURE YOU:  Understand these instructions.  Will watch your condition.  Will get help right away if you are not doing well or get worse. Document Released: 09/14/2004 Document Revised: 05/28/2013 Document Reviewed: 11/12/2013 [facility] Patient Information 2015 [place], [place]. This information is not intended to replace advice given to you  by your health care provider. Make sure you discuss any questions you have with your health care provider.

## 2014-10-02 NOTE — Progress Notes (Signed)
Pre visit review using our clinic review tool, if applicable. No additional management support is needed unless otherwise documented below in the visit note/SLS  

## 2014-10-09 ENCOUNTER — Ambulatory Visit (INDEPENDENT_AMBULATORY_CARE_PROVIDER_SITE_OTHER): Payer: Commercial Managed Care - PPO | Admitting: Physician Assistant

## 2014-10-09 ENCOUNTER — Encounter: Payer: Self-pay | Admitting: Physician Assistant

## 2014-10-09 VITALS — BP 130/85 | HR 79 | Temp 98.6°F | Resp 16 | Ht 65.0 in | Wt 135.2 lb

## 2014-10-09 DIAGNOSIS — F419 Anxiety disorder, unspecified: Principal | ICD-10-CM

## 2014-10-09 DIAGNOSIS — F0781 Postconcussional syndrome: Secondary | ICD-10-CM

## 2014-10-09 DIAGNOSIS — F329 Major depressive disorder, single episode, unspecified: Secondary | ICD-10-CM

## 2014-10-09 DIAGNOSIS — F418 Other specified anxiety disorders: Secondary | ICD-10-CM

## 2014-10-09 DIAGNOSIS — H6503 Acute serous otitis media, bilateral: Secondary | ICD-10-CM

## 2014-10-09 MED ORDER — ALPRAZOLAM 0.25 MG PO TABS: 0.2500 mg | ORAL_TABLET | Freq: Two times a day (BID) | ORAL | Status: DC | PRN

## 2014-10-09 NOTE — Assessment & Plan Note (Signed)
Symptoms have resolved.  Supportive measures reiterated.  No need for further assessment/intervention at present.

## 2014-10-09 NOTE — Assessment & Plan Note (Signed)
Patient encouraged to use Flonase as directed in addition to her Zyrtec.

## 2014-10-09 NOTE — Patient Instructions (Signed)
Please continue the Zyrtec but get the Flonase over the counter and take as directed.  Please be sure to continue your Zoloft for the anxiety.  Use Xanax as directed only if needed for acute anxiety.  Follow-up with Dr. Birdie Riddle as scheduled.

## 2014-10-09 NOTE — Assessment & Plan Note (Signed)
Continue zoloft as directed.  Will restart Xanax on PRN basis.  PCP approval given for this.  Follow-up as scheduled.

## 2014-10-09 NOTE — Progress Notes (Signed)
Pre visit review using our clinic review tool, if applicable. No additional management support is needed unless otherwise documented below in the visit note/SLS  

## 2014-10-09 NOTE — Progress Notes (Signed)
    Patient presents to clinic today for follow-up of post-concussion syndrome.  Patient endorses symptoms have improved since last visit.  Still having some pressure behind the ears bilaterally.  Patient also endorses breakthrough anxiety despite consistent use of her Zoloft. Endorses some acute anxiety over the past few days.  Denies panic attack but endorse severe anxiety with tingling of fingertips and around mouth.  Past Medical History  Diagnosis Date  . Syncope     Current Outpatient Prescriptions on File Prior to Visit  Medication Sig Dispense Refill  . desogestrel-ethinyl estradiol (AZURETTE) 0.15-0.02/0.01 MG (21/5) tablet Take 1 tablet by mouth daily. 1 Package 12  . Multiple Vitamin (MULTIVITAMIN) tablet Take 1 tablet by mouth daily.    . sertraline (ZOLOFT) 50 MG tablet TAKE 1 TABLET BY MOUTH EVERY DAY 30 tablet 6   No current facility-administered medications on file prior to visit.    Allergies  Allergen Reactions  . Codeine Nausea And Vomiting    Family History  Problem Relation Age of Onset  . Cancer Mother     ovarian  . Stroke Maternal Grandfather   . Diabetes Maternal Grandfather     History   Social History  . Marital Status: Married    Spouse Name: N/A  . Number of Children: N/A  . Years of Education: N/A   Social History Main Topics  . Smoking status: Never Smoker   . Smokeless tobacco: Never Used  . Alcohol Use: Yes     Comment: Occas  . Drug Use: No  . Sexual Activity: Yes    Birth Control/ Protection: Pill   Other Topics Concern  . None   Social History Narrative   Review of Systems - See HPI.  All other ROS are negative.  BP 130/85 mmHg  Pulse 79  Temp(Src) 98.6 F (37 C) (Oral)  Resp 16  Ht 5\' 5"  (1.651 m)  Wt 135 lb 4 oz (61.349 kg)  BMI 22.51 kg/m2  SpO2 100%  LMP 10/02/2014  Physical Exam  Constitutional: She is oriented to person, place, and time and well-developed, well-nourished, and in no distress.  HENT:  Head:  Normocephalic and atraumatic.  Right Ear: External ear and ear canal normal.  Left Ear: External ear and ear canal normal.  Nose: Nose normal.  Mouth/Throat: Uvula is midline, oropharynx is clear and moist and mucous membranes are normal.  Fluid still noted behind TM bilaterally.  Cardiovascular: Normal rate, regular rhythm, normal heart sounds and intact distal pulses.   Pulmonary/Chest: Effort normal and breath sounds normal. No respiratory distress. She has no wheezes. She has no rales. She exhibits no tenderness.  Neurological: She is alert and oriented to person, place, and time.  Vitals reviewed.  Recent Results (from the past 2160 hour(s))  POCT urine pregnancy     Status: None   Collection Time: 10/02/14  5:17 PM  Result Value Ref Range   Preg Test, Ur Negative     Assessment/Plan: Acute serous otitis media Patient encouraged to use Flonase as directed in addition to her Zyrtec.   Post concussion syndrome Symptoms have resolved.  Supportive measures reiterated.  No need for further assessment/intervention at present.   Anxiety and depression Continue zoloft as directed.  Will restart Xanax on PRN basis.  PCP approval given for this.  Follow-up as scheduled.

## 2014-10-19 ENCOUNTER — Telehealth: Payer: Self-pay

## 2014-10-19 NOTE — Telephone Encounter (Signed)
Agree w/ need for evaluation.  Concerned that pt is waiting until 3/2.  Please stress that if her sxs change or worsen, she needs to go to ER

## 2014-10-19 NOTE — Telephone Encounter (Signed)
C/o:  Continues to experience nausea, left ear pain, and dizziness post concussion (saw Elyn Aquas, PA-C on 10/02/14 and 10/09/14 for similar symptoms).  She is also experiencing post nasal drainage, fatigue, increased heart rate, chest tightness and aches (which she thinks is anxiety related), and slight shortness of breath.  She denies severe headaches, jaw pain, arm pain, diaphoresis, and abdominal pain.  Pt sounds calm and does not seem to be in acute distress, but is requesting to be seen.    Advice: No opening today with Dr. Birdie Riddle.  Appointment offered today 11:00 am with another provider.  Pt declined, stating she was at work and will not be able to take off.  She asked that an appointment be offered later in the week with Dr. Birdie Riddle.  Appointment scheduled for 10/21/14 @ 2:15 pm per her request. Pt was strongly encouraged to go to ER if symptoms worsen or new symptoms develop.

## 2014-10-20 ENCOUNTER — Encounter: Payer: Self-pay | Admitting: Primary Care

## 2014-10-20 ENCOUNTER — Ambulatory Visit (INDEPENDENT_AMBULATORY_CARE_PROVIDER_SITE_OTHER): Payer: Commercial Managed Care - PPO | Admitting: Primary Care

## 2014-10-20 VITALS — BP 120/78 | HR 80 | Resp 16 | Ht 65.0 in | Wt 130.2 lb

## 2014-10-20 DIAGNOSIS — R079 Chest pain, unspecified: Secondary | ICD-10-CM | POA: Diagnosis not present

## 2014-10-20 DIAGNOSIS — F329 Major depressive disorder, single episode, unspecified: Secondary | ICD-10-CM

## 2014-10-20 DIAGNOSIS — F418 Other specified anxiety disorders: Secondary | ICD-10-CM

## 2014-10-20 DIAGNOSIS — F411 Generalized anxiety disorder: Secondary | ICD-10-CM

## 2014-10-20 DIAGNOSIS — N939 Abnormal uterine and vaginal bleeding, unspecified: Secondary | ICD-10-CM | POA: Diagnosis not present

## 2014-10-20 DIAGNOSIS — F419 Anxiety disorder, unspecified: Secondary | ICD-10-CM

## 2014-10-20 DIAGNOSIS — F0781 Postconcussional syndrome: Secondary | ICD-10-CM

## 2014-10-20 LAB — CBC WITH DIFFERENTIAL/PLATELET
BASOS ABS: 0 10*3/uL (ref 0.0–0.1)
BASOS PCT: 0.4 % (ref 0.0–3.0)
Eosinophils Absolute: 0.1 10*3/uL (ref 0.0–0.7)
Eosinophils Relative: 0.9 % (ref 0.0–5.0)
HCT: 42 % (ref 36.0–46.0)
Hemoglobin: 14.3 g/dL (ref 12.0–15.0)
Lymphocytes Relative: 26.4 % (ref 12.0–46.0)
Lymphs Abs: 2.1 10*3/uL (ref 0.7–4.0)
MCHC: 34 g/dL (ref 30.0–36.0)
MCV: 91.4 fl (ref 78.0–100.0)
MONO ABS: 0.4 10*3/uL (ref 0.1–1.0)
Monocytes Relative: 5.5 % (ref 3.0–12.0)
Neutro Abs: 5.2 10*3/uL (ref 1.4–7.7)
Neutrophils Relative %: 66.8 % (ref 43.0–77.0)
PLATELETS: 239 10*3/uL (ref 150.0–400.0)
RBC: 4.59 Mil/uL (ref 3.87–5.11)
RDW: 12.1 % (ref 11.5–15.5)
WBC: 7.8 10*3/uL (ref 4.0–10.5)

## 2014-10-20 LAB — TROPONIN I: TNIDX: 0.01 ug/l (ref 0.00–0.06)

## 2014-10-20 LAB — TSH: TSH: 3.47 u[IU]/mL (ref 0.35–4.50)

## 2014-10-20 MED ORDER — SERTRALINE HCL 100 MG PO TABS: 100.0000 mg | ORAL_TABLET | Freq: Every day | ORAL | Status: DC

## 2014-10-20 MED ORDER — CYCLOBENZAPRINE HCL 5 MG PO TABS: 5.0000 mg | ORAL_TABLET | Freq: Three times a day (TID) | ORAL | Status: DC | PRN

## 2014-10-20 NOTE — Assessment & Plan Note (Signed)
Exam unremarkable. Improved with Flonase and Zyrtec. Continue as needed.

## 2014-10-20 NOTE — Assessment & Plan Note (Signed)
Discussed this patient with Dr. Birdie Riddle. We agree that her Zoloft should be increased to 100mg  daily, sent new RX to pharmacy and instructed patient to increase. Also instructed patient to go to the Emergency Department for any thoughts of suicide. Continue Xanax as needed.  Obtain TSH to rule out any thyroid involvement.

## 2014-10-20 NOTE — Patient Instructions (Signed)
Complete lab work prior to leaving. Start Zoloft 100mg  daily for anxiety. You may take two of your 50mg  tablets daily until out then switch to the 100mg  tablets. Your ECG was normal. Take Flexeril as needed for chest wall soreness. You may also take ibuprofen for inflammation of chest wall. Take care and please call us if your pain feels worse.

## 2014-10-20 NOTE — Assessment & Plan Note (Signed)
Resolved. Patient has no complaints. Re-addressed this due to increased anxiety.

## 2014-10-20 NOTE — Progress Notes (Signed)
Subjective:    Patient ID: Kelly Stanley, female    DOB: 1976-02-21, 39 y.o.   MRN: [id number]  HPI  Ms. Kelly Stanley is a 39 year old female who presents today with multiple concerns.  1) Chest pain and anxiety: Chest pain has been present intermittently for 2 weeks which started when she experienced a panic attack several weeks ago. She was recently re-started on Xanax which she had taken in the past for panic attacks. She reports one panic attack in the past 2 weeks but has been feeling anxious and tense since attack. The Xanax helped to calm her down. The pain is located across the anterior chest without radiation down arms or into mandible. She works out regularly and reports to lighter work outs since the panic attack 2 weeks ago. She reports the pain does feel better with massage but is also tender to touch. She believes the chest pain is related to the anxiety and describes it as achy, sore, and tense. Denies pain upon deep inspiration, does take an oral contraceptive, denies smoking. Also denies nausea, SOB, diaphoresis.  2) Ear pain: Left ear pain since 2/12. Patient has been taking Flonase x 7 days, with Claritin, and then Zyrtec. She's had a  decrease in appetite yesterday and today. Denies headaches, sinus pain, sore throat, fevers, cough.  3) Post Concussive Syndrome: Initially seen on 2/12 and re-evaluated on 2/19, denies dizziness, headaches, and believes this to be resolved.  5) Vaginal Bleeding: Had an eisiode of heavy vaginal bleeding starting 2/12 (one week prior to typical cycle), then continued and ended on 2/26 which was the end of her expected cycle. She has noticed a decrease in her stamina during workouts lately and generalized fatigue.    Review of Systems  Constitutional: Positive for chills and appetite change. Negative for fever.  HENT: Positive for ear pain and sore throat. Negative for congestion, hearing loss, rhinorrhea, sinus pressure and trouble swallowing.     Respiratory: Positive for shortness of breath. Negative for cough.   Cardiovascular: Positive for chest pain. Negative for palpitations.       See HPI  Gastrointestinal: Positive for diarrhea. Negative for abdominal pain and blood in stool.       Diarrhea x 2 days  Genitourinary: Positive for vaginal bleeding. Negative for vaginal pain and pelvic pain.  Musculoskeletal: Positive for myalgias.       Across anterior chest wall.  Neurological: Negative for dizziness and headaches.  Psychiatric/Behavioral: Positive for agitation. Negative for suicidal ideas, hallucinations, confusion, sleep disturbance and self-injury. The patient is nervous/anxious.        Denies depression, SI/HI.       Past Medical History  Diagnosis Date  . Syncope     History   Social History  . Marital Status: Married    Spouse Name: N/A  . Number of Children: N/A  . Years of Education: N/A   Occupational History  . Not on file.   Social History Main Topics  . Smoking status: Never Smoker   . Smokeless tobacco: Never Used  . Alcohol Use: Yes     Comment: Occas  . Drug Use: No  . Sexual Activity: Yes    Birth Control/ Protection: Pill   Other Topics Concern  . Not on file   Social History Narrative    Past Surgical History  Procedure Laterality Date  . Knee arthroscopy w/ lateral release Left 1997  . Appendectomy      Family History  Problem Relation Age of Onset  . Cancer Mother     ovarian  . Stroke Maternal Grandfather   . Diabetes Maternal Grandfather     Allergies  Allergen Reactions  . Codeine Nausea And Vomiting    Current Outpatient Prescriptions on File Prior to Visit  Medication Sig Dispense Refill  . ALPRAZolam (XANAX) 0.25 MG tablet Take 1 tablet (0.25 mg total) by mouth 2 (two) times daily as needed for anxiety. 20 tablet 0  . cetirizine (ZYRTEC) 10 MG tablet Take 10 mg by mouth daily.    Marland Kitchen desogestrel-ethinyl estradiol (AZURETTE) 0.15-0.02/0.01 MG (21/5) tablet Take  1 tablet by mouth daily. 1 Package 12  . Multiple Vitamin (MULTIVITAMIN) tablet Take 1 tablet by mouth daily.     No current facility-administered medications on file prior to visit.    BP 120/78 mmHg  Pulse 80  Resp 16  Ht 5\' 5"  (1.651 m)  Wt 130 lb 3.2 oz (59.058 kg)  BMI 21.67 kg/m2  SpO2 99%  LMP 10/02/2014    Objective:   Physical Exam  Constitutional: She is oriented to person, place, and time. She appears well-developed.  HENT:  Head: Normocephalic.  Right Ear: External ear normal.  Left Ear: External ear normal.  Nose: Nose normal.  Mouth/Throat: Oropharynx is clear and moist.  Eyes: Pupils are equal, round, and reactive to light.  Neck: Neck supple. No thyromegaly present.  Cardiovascular: Normal rate, regular rhythm and normal heart sounds.   Pulmonary/Chest: Effort normal and breath sounds normal. She exhibits tenderness.  Musculoskeletal: She exhibits tenderness.  Tender across anterior chest wall upon palpation.  Lymphadenopathy:    She has no cervical adenopathy.  Neurological: She is alert and oriented to person, place, and time.  Skin: Skin is warm and dry.  Psychiatric: She has a normal mood and affect.  Seems anxious, but very cooperative and attentive to exam          Assessment & Plan:  Vaginal Bleeding: Appears stable in office. Ordered CBC to evaluate for anemia due to acute blood loss.  Anxiety State: Increased Zoloft to 100mg  daily, continue to use Xanax as needed for panic attacks. Encouraged to go to ED if thoughts of suicide or worsening anxiety.

## 2014-10-20 NOTE — Assessment & Plan Note (Addendum)
Reproducible pain to chest upon palpation, suspect costochondritis. EKG unremarkable. Discussed this case in detail with Dr. Birdie Riddle and it was recommended to obtain a Troponin level to rule out a cardiac event; however my suspicion is low.  We agreed that this case seemed to be more MSK related.  Provided patient with RX for Flexeril and encouraged ibuprofen for pain and inflammation. Instructed patient to go to ED if experiencing worsening of pain and/or feeling worse overall. Obtain Troponin level in lab today.

## 2014-10-21 ENCOUNTER — Telehealth: Payer: Self-pay | Admitting: Family Medicine

## 2014-10-21 ENCOUNTER — Ambulatory Visit: Payer: Commercial Managed Care - PPO | Admitting: Family Medicine

## 2014-10-21 NOTE — Telephone Encounter (Signed)
No, I spoke with her already. Thank you!

## 2014-10-21 NOTE — Telephone Encounter (Signed)
Caller name: Stevana, Dufner Relation to pt: self  Call back number: [phone]   Reason for call:  Pt returning your call regarding lab results

## 2014-10-21 NOTE — Telephone Encounter (Signed)
Spoke with pt and advised her of normal labs. Is there anything else you would like me to tell pt?

## 2014-11-18 ENCOUNTER — Telehealth: Payer: Self-pay | Admitting: *Deleted

## 2014-11-18 NOTE — Telephone Encounter (Signed)
Unable to reach patient at time of Pre-Visit Call.  Left message for patient to return call when available.    

## 2014-11-19 ENCOUNTER — Encounter: Payer: Self-pay | Admitting: Family Medicine

## 2014-11-19 ENCOUNTER — Ambulatory Visit (INDEPENDENT_AMBULATORY_CARE_PROVIDER_SITE_OTHER): Payer: Commercial Managed Care - PPO | Admitting: Family Medicine

## 2014-11-19 VITALS — BP 118/78 | HR 81 | Temp 98.1°F | Resp 16 | Ht 65.0 in | Wt 130.1 lb

## 2014-11-19 DIAGNOSIS — Z0000 Encounter for general adult medical examination without abnormal findings: Secondary | ICD-10-CM

## 2014-11-19 DIAGNOSIS — Z1283 Encounter for screening for malignant neoplasm of skin: Secondary | ICD-10-CM | POA: Diagnosis not present

## 2014-11-19 LAB — BASIC METABOLIC PANEL
BUN: 13 mg/dL (ref 6–23)
CALCIUM: 9.4 mg/dL (ref 8.4–10.5)
CO2: 29 mEq/L (ref 19–32)
Chloride: 103 mEq/L (ref 96–112)
Creatinine, Ser: 0.88 mg/dL (ref 0.40–1.20)
GFR: 76.65 mL/min (ref >60.00)
GLUCOSE: 80 mg/dL (ref 70–99)
Potassium: 4.1 mEq/L (ref 3.5–5.1)
SODIUM: 137 meq/L (ref 135–145)

## 2014-11-19 LAB — LIPID PANEL
Cholesterol: 178 mg/dL (ref 0–200)
HDL: 74.6 mg/dL (ref >39.00)
LDL Cholesterol: 86 mg/dL (ref 0–99)
NonHDL: 103.4
Total CHOL/HDL Ratio: 2
Triglycerides: 89 mg/dL (ref 0.0–149.0)
VLDL: 17.8 mg/dL (ref 0.0–40.0)

## 2014-11-19 LAB — VITAMIN D 25 HYDROXY (VIT D DEFICIENCY, FRACTURES): VITD: 40.01 ng/mL (ref 30.00–100.00)

## 2014-11-19 LAB — HEPATIC FUNCTION PANEL
ALT: 17 U/L (ref 0–35)
AST: 22 U/L (ref 0–37)
Albumin: 4 g/dL (ref 3.5–5.2)
Alkaline Phosphatase: 53 U/L (ref 39–117)
Bilirubin, Direct: 0.1 mg/dL (ref 0.0–0.3)
Total Bilirubin: 0.6 mg/dL (ref 0.2–1.2)
Total Protein: 7.1 g/dL (ref 6.0–8.3)

## 2014-11-19 NOTE — Progress Notes (Signed)
Pre visit review using our clinic review tool, if applicable. No additional management support is needed unless otherwise documented below in the visit note. 

## 2014-11-19 NOTE — Assessment & Plan Note (Signed)
Pt's PE WNL.  UTD on GYN.  Check labs.  Stressed that if she continues to have dizziness even after eye exam and prescription change, she needs to contact me so I can send her to Neuro.  Pt expressed understanding and is in agreement w/ plan.  Anticipatory guidance provided.

## 2014-11-19 NOTE — Patient Instructions (Signed)
Follow up in 1 year or as needed We'll notify you of your lab results and make any changes if needed Keep up the good work!  You look great!! We'll call you with your Derm appt for the skin check If the dizziness continues after you get your eyes checked, please call me! Call with any questions or concerns Happy Spring!!!

## 2014-11-19 NOTE — Progress Notes (Signed)
   Subjective:    Patient ID: Kelly Stanley, female    DOB: 01/02/1976, 39 y.o.   MRN: [id number]  HPI CPE- UTD on GYN.  Continues to have dizziness 7 weeks after concussion.  No headaches.   Review of Systems Patient reports no hearing changes, adenopathy,fever, weight change,  persistant/recurrent hoarseness , swallowing issues, chest pain, palpitations, edema, persistant/recurrent cough, hemoptysis, dyspnea (rest/exertional/paroxysmal nocturnal), gastrointestinal bleeding (melena, rectal bleeding), abdominal pain, significant heartburn, bowel changes, GU symptoms (dysuria, hematuria, incontinence), Gyn symptoms (abnormal  bleeding, pain),  syncope, focal weakness, memory loss, numbness & tingling, skin/hair/nail changes, abnormal bruising or bleeding, anxiety, or depression.   + visual changes- pt aware she needs to see eye doctor for new prescription    Objective:   Physical Exam General Appearance:    Alert, cooperative, no distress, appears stated age  Head:    Normocephalic, without obvious abnormality, atraumatic  Eyes:    PERRL, conjunctiva/corneas clear, EOM's intact, fundi    benign, both eyes  Ears:    Normal TM's and external ear canals, both ears  Nose:   Nares normal, septum midline, mucosa normal, no drainage    or sinus tenderness  Throat:   Lips, mucosa, and tongue normal; teeth and gums normal  Neck:   Supple, symmetrical, trachea midline, no adenopathy;    Thyroid: no enlargement/tenderness/nodules  Back:     Symmetric, no curvature, ROM normal, no CVA tenderness  Lungs:     Clear to auscultation bilaterally, respirations unlabored  Chest Wall:    No tenderness or deformity   Heart:    Regular rate and rhythm, S1 and S2 normal, no murmur, rub   or gallop  Breast Exam:    Deferred to GYN  Abdomen:     Soft, non-tender, bowel sounds active all four quadrants,    no masses, no organomegaly  Genitalia:    Deferred to GYN  Rectal:    Extremities:   Extremities  normal, atraumatic, no cyanosis or edema  Pulses:   2+ and symmetric all extremities  Skin:   Skin color, texture, turgor normal, no rashes or lesions  Lymph nodes:   Cervical, supraclavicular, and axillary nodes normal  Neurologic:   CNII-XII intact, normal strength, sensation and reflexes    throughout          Assessment & Plan:

## 2014-12-31 ENCOUNTER — Ambulatory Visit (INDEPENDENT_AMBULATORY_CARE_PROVIDER_SITE_OTHER): Payer: Commercial Managed Care - PPO | Admitting: Gynecology

## 2014-12-31 ENCOUNTER — Encounter: Payer: Self-pay | Admitting: Gynecology

## 2014-12-31 ENCOUNTER — Other Ambulatory Visit: Payer: Self-pay | Admitting: Gynecology

## 2014-12-31 VITALS — BP 120/74 | Ht 64.0 in | Wt 129.0 lb

## 2014-12-31 DIAGNOSIS — Z01419 Encounter for gynecological examination (general) (routine) without abnormal findings: Secondary | ICD-10-CM

## 2014-12-31 MED ORDER — SERTRALINE HCL 100 MG PO TABS: 50.0000 mg | ORAL_TABLET | Freq: Every day | ORAL | Status: DC

## 2014-12-31 MED ORDER — DESOGESTREL-ETHINYL ESTRADIOL 0.15-0.02/0.01 MG (21/5) PO TABS: 1.0000 | ORAL_TABLET | Freq: Every day | ORAL | Status: DC

## 2014-12-31 NOTE — Patient Instructions (Signed)
You may obtain a copy of any labs that were done today by logging onto MyChart as outlined in the instructions provided with your AVS (after visit summary). The office will not call with normal lab results but certainly if there are any significant abnormalities then we will contact you.   Health Maintenance, Female A healthy lifestyle and preventative care can promote health and wellness.  Maintain regular health, dental, and eye exams.  Eat a healthy diet. Foods like vegetables, fruits, whole grains, low-fat dairy products, and lean protein foods contain the nutrients you need without too many calories. Decrease your intake of foods high in solid fats, added sugars, and salt. Get information about a proper diet from your caregiver, if necessary.  Regular physical exercise is one of the most important things you can do for your health. Most adults should get at least 150 minutes of moderate-intensity exercise (any activity that increases your heart rate and causes you to sweat) each week. In addition, most adults need muscle-strengthening exercises on 2 or more days a week.   Maintain a healthy weight. The body mass index (BMI) is a screening tool to identify possible weight problems. It provides an estimate of body fat based on height and weight. Your caregiver can help determine your BMI, and can help you achieve or maintain a healthy weight. For adults 20 years and older:  A BMI below 18.5 is considered underweight.  A BMI of 18.5 to 24.9 is normal.  A BMI of 25 to 29.9 is considered overweight.  A BMI of 30 and above is considered obese.  Maintain normal blood lipids and cholesterol by exercising and minimizing your intake of saturated fat. Eat a balanced diet with plenty of fruits and vegetables. Blood tests for lipids and cholesterol should begin at age 54 and be repeated every 5 years. If your lipid or cholesterol levels are high, you are over 50, or you are a high risk for heart  disease, you may need your cholesterol levels checked more frequently.Ongoing high lipid and cholesterol levels should be treated with medicines if diet and exercise are not effective.  If you smoke, find out from your caregiver how to quit. If you do not use tobacco, do not start.  Lung cancer screening is recommended for adults aged 20 80 years who are at high risk for developing lung cancer because of a history of smoking. Yearly low-dose computed tomography (CT) is recommended for people who have at least a 30-pack-year history of smoking and are a current smoker or have quit within the past 15 years. A pack year of smoking is smoking an average of 1 pack of cigarettes a day for 1 year (for example: 1 pack a day for 30 years or 2 packs a day for 15 years). Yearly screening should continue until the smoker has stopped smoking for at least 15 years. Yearly screening should also be stopped for people who develop a health problem that would prevent them from having lung cancer treatment.  If you are pregnant, do not drink alcohol. If you are breastfeeding, be very cautious about drinking alcohol. If you are not pregnant and choose to drink alcohol, do not exceed 1 drink per day. One drink is considered to be 12 ounces (355 mL) of beer, 5 ounces (148 mL) of wine, or 1.5 ounces (44 mL) of liquor.  Avoid use of street drugs. Do not share needles with anyone. Ask for help if you need support or instructions about stopping  the use of drugs.  High blood pressure causes heart disease and increases the risk of stroke. Blood pressure should be checked at least every 1 to 2 years. Ongoing high blood pressure should be treated with medicines, if weight loss and exercise are not effective.  If you are 59 to 38 years old, ask your caregiver if you should take aspirin to prevent strokes.  Diabetes screening involves taking a blood sample to check your fasting blood sugar level. This should be done once every 3  years, after age 91, if you are within normal weight and without risk factors for diabetes. Testing should be considered at a younger age or be carried out more frequently if you are overweight and have at least 1 risk factor for diabetes.  Breast cancer screening is essential preventative care for women. You should practice "breast self-awareness." This means understanding the normal appearance and feel of your breasts and may include breast self-examination. Any changes detected, no matter how small, should be reported to a caregiver. Women in their 66s and 30s should have a clinical breast exam (CBE) by a caregiver as part of a regular health exam every 1 to 3 years. After age 101, women should have a CBE every year. Starting at age 100, women should consider having a mammogram (breast X-ray) every year. Women who have a family history of breast cancer should talk to their caregiver about genetic screening. Women at a high risk of breast cancer should talk to their caregiver about having an MRI and a mammogram every year.  Breast cancer gene (BRCA)-related cancer risk assessment is recommended for women who have family members with BRCA-related cancers. BRCA-related cancers include breast, ovarian, tubal, and peritoneal cancers. Having family members with these cancers may be associated with an increased risk for harmful changes (mutations) in the breast cancer genes BRCA1 and BRCA2. Results of the assessment will determine the need for genetic counseling and BRCA1 and BRCA2 testing.  The Pap test is a screening test for cervical cancer. Women should have a Pap test starting at age 57. Between ages 25 and 35, Pap tests should be repeated every 2 years. Beginning at age 37, you should have a Pap test every 3 years as long as the past 3 Pap tests have been normal. If you had a hysterectomy for a problem that was not cancer or a condition that could lead to cancer, then you no longer need Pap tests. If you are  between ages 50 and 76, and you have had normal Pap tests going back 10 years, you no longer need Pap tests. If you have had past treatment for cervical cancer or a condition that could lead to cancer, you need Pap tests and screening for cancer for at least 20 years after your treatment. If Pap tests have been discontinued, risk factors (such as a new sexual partner) need to be reassessed to determine if screening should be resumed. Some women have medical problems that increase the chance of getting cervical cancer. In these cases, your caregiver may recommend more frequent screening and Pap tests.  The human papillomavirus (HPV) test is an additional test that may be used for cervical cancer screening. The HPV test looks for the virus that can cause the cell changes on the cervix. The cells collected during the Pap test can be tested for HPV. The HPV test could be used to screen women aged 44 years and older, and should be used in women of any age  who have unclear Pap test results. After the age of 55, women should have HPV testing at the same frequency as a Pap test.  Colorectal cancer can be detected and often prevented. Most routine colorectal cancer screening begins at the age of 44 and continues through age 20. However, your caregiver may recommend screening at an earlier age if you have risk factors for colon cancer. On a yearly basis, your caregiver may provide home test kits to check for hidden blood in the stool. Use of a small camera at the end of a tube, to directly examine the colon (sigmoidoscopy or colonoscopy), can detect the earliest forms of colorectal cancer. Talk to your caregiver about this at age 86, when routine screening begins. Direct examination of the colon should be repeated every 5 to 10 years through age 13, unless early forms of pre-cancerous polyps or small growths are found.  Hepatitis C blood testing is recommended for all people born from [birth year] through 1964 and any  individual with known risks for hepatitis C.  Practice safe sex. Use condoms and avoid high-risk sexual practices to reduce the spread of sexually transmitted infections (STIs). Sexually active women aged 36 and younger should be checked for Chlamydia, which is a common sexually transmitted infection. Older women with new or multiple partners should also be tested for Chlamydia. Testing for other STIs is recommended if you are sexually active and at increased risk.  Osteoporosis is a disease in which the bones lose minerals and strength with aging. This can result in serious bone fractures. The risk of osteoporosis can be identified using a bone density scan. Women ages 20 and over and women at risk for fractures or osteoporosis should discuss screening with their caregivers. Ask your caregiver whether you should be taking a calcium supplement or vitamin D to reduce the rate of osteoporosis.  Menopause can be associated with physical symptoms and risks. Hormone replacement therapy is available to decrease symptoms and risks. You should talk to your caregiver about whether hormone replacement therapy is right for you.  Use sunscreen. Apply sunscreen liberally and repeatedly throughout the day. You should seek shade when your shadow is shorter than you. Protect yourself by wearing long sleeves, pants, a wide-brimmed hat, and sunglasses year round, whenever you are outdoors.  Notify your caregiver of new moles or changes in moles, especially if there is a change in shape or color. Also notify your caregiver if a mole is larger than the size of a pencil eraser.  Stay current with your immunizations. Document Released: 02/20/2011 Document Revised: 12/02/2012 Document Reviewed: 02/20/2011 [facility] Patient Information 2014 [employer].

## 2014-12-31 NOTE — Progress Notes (Signed)
Kelly Stanley October 06, 1975 [id number]        39 y.o.  [id number] for annual exam.  Doing well.  Past medical history,surgical history, problem list, medications, allergies, family history and social history were all reviewed and documented as reviewed in the EPIC chart.  ROS:  Performed with pertinent positives and negatives included in the history, assessment and plan.   Additional significant findings :  none   Exam: Kim Counsellor Vitals:   12/31/14 0832  BP: 120/74  Height: '5\' 4"'  (1.626 m)  Weight: 129 lb (58.514 kg)   General appearance:  Normal affect, orientation and appearance. Skin: Grossly normal HEENT: Without gross lesions.  No cervical or supraclavicular adenopathy. Thyroid normal.  Lungs:  Clear without wheezing, rales or rhonchi Cardiac: RR, without RMG Abdominal:  Soft, nontender, without masses, guarding, rebound, organomegaly or hernia Breasts:  Examined lying and sitting without masses, retractions, discharge or axillary adenopathy. Pelvic:  Ext/BUS/vagina normal  Cervix normal  Uterus anteverted, normal size, shape and contour, midline and mobile nontender   Adnexa  Without masses or tenderness    Anus and perineum  Normal   Rectovaginal  Normal sphincter tone without palpated masses or tenderness.    Assessment/Plan:  39 y.o. [id number] female for annual exam with regular menses, oral contraceptives.   1. Contraceptive management. Patient continues on the desogestrel low dose pill. Doing well with this wants to continue. We discussed multiple times the risks to include increased risk of stroke heart attack DVT. She is athletic does not smoke. Does have a maternal history of ovarian cancer. Benefits of risk reduction reviewed. Refill 1 year provided. 2. 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 Patient passed away 22-Oct-2012. Negative BRCA tested historically. No other family with ovarian, breast, colon or uterine cancer.  She had been doing CA-125  but stopped these and is comfortable just with monitoring. 3. Anxiety. Patient continues on Zoloft 50 mg daily doing well. I refilled her 1 year. 4. Pap smear/HPV 2013-10-22 negative. No Pap smear done today. History of LGSIL October 23, 2003 through 22-Oct-2005. Colposcopies normal at that time. Pap smears negative since. We'll plan repeat Pap smear in 3-5 year interval per current screening guidelines. 5. Mammography.. Patient had mammogram 10-22-12 secondary to questionable palpable area. Her mammogram was normal. We'll plan repeat at age 39. SBE monthly reviewed. 6. Health maintenance. Patient recently had full blood work done at Dr. Virgil Benedict office. Follow up in one year, sooner as needed.     [name] MD, 9:03 AM 12/31/2014

## 2015-01-01 LAB — URINALYSIS W MICROSCOPIC + REFLEX CULTURE
Bacteria, UA: NONE SEEN
Bilirubin Urine: NEGATIVE
Casts: NONE SEEN
Crystals: NONE SEEN
Glucose, UA: NEGATIVE mg/dL
Hgb urine dipstick: NEGATIVE
Ketones, ur: NEGATIVE mg/dL
Leukocytes, UA: NEGATIVE
NITRITE: NEGATIVE
PH: 6 (ref 5.0–8.0)
Protein, ur: NEGATIVE mg/dL
SPECIFIC GRAVITY, URINE: 1.016 (ref 1.005–1.030)
SQUAMOUS EPITHELIAL / LPF: NONE SEEN
Urobilinogen, UA: 0.2 mg/dL (ref 0.0–1.0)

## 2015-06-09 ENCOUNTER — Telehealth: Payer: Self-pay

## 2015-06-09 NOTE — Telephone Encounter (Signed)
Patient called in voice mail complaining of   #1 Sweating at night. She said she has done this most of her life but it is worse now. "drenching". #2  Left sided pain. She said not really pain more of a twinging feeling that she notices when she is still. #3  Her periods are irregular.  I returned her call and got her voice mail and recommended she call back and schedule an office visit to see Dr. Loetta Rough.

## 2015-07-01 ENCOUNTER — Other Ambulatory Visit: Payer: Self-pay | Admitting: Gynecology

## 2015-07-01 ENCOUNTER — Encounter: Payer: Self-pay | Admitting: Gynecology

## 2015-07-01 ENCOUNTER — Ambulatory Visit (INDEPENDENT_AMBULATORY_CARE_PROVIDER_SITE_OTHER): Payer: Commercial Managed Care - PPO | Admitting: Gynecology

## 2015-07-01 VITALS — BP 116/76

## 2015-07-01 DIAGNOSIS — R61 Generalized hyperhidrosis: Secondary | ICD-10-CM | POA: Diagnosis not present

## 2015-07-01 DIAGNOSIS — R1032 Left lower quadrant pain: Secondary | ICD-10-CM

## 2015-07-01 NOTE — Patient Instructions (Signed)
Follow up for ultrasound as scheduled 

## 2015-07-01 NOTE — Progress Notes (Signed)
Kelly Stanley [birth year] [id number]        39 y.o.  [id number] Presents complaining of several different issues. Notes that she always seems to have had some night sweats but they seem to be accelerating where she is trenching close at night. Not during the day. Also with some nausea and left lower quadrant discomfort that comes and goes. No vomiting. No significant weight changes skin or hair changes. No diarrhea or constipation. No urinary symptoms such as urgency frequency dysuria. No fever chills. Continues on oral contraceptives and symptoms do not seem to change whether she is on her pill free week or during her pills.  Past medical history,surgical history, problem list, medications, allergies, family history and social history were all reviewed and documented in the EPIC chart.  Directed ROS with pertinent positives and negatives documented in the history of present illness/assessment and plan.  Exam: Kim assistant Filed Vitals:   07/01/15 0809  BP: 116/76   General appearance:  Normal HEENT normal without evidence of thyromegaly Lungs clear bilaterally Cardiac regular rate no rubs murmurs or gallops Abdomen soft nontender without masses guarding rebound organomegaly Pelvic external BUS vagina normal. Cervix normal. Uterus normal size midline mobile nontender. Adnexa without masses or tenderness.  Assessment/Plan:  39 y.o. [id number] with the above history and normal exam.  Will start with Baseline CBC comprehensive metabolic panel amylase sedimentation rate ANA FSH TSH and pelvic ultrasound given her maternal history of ovarian cancer. Discussed possible referral to GI if above labs normal and pain/nausea continues.    Kelly Auerbach MD, 8:24 AM 07/01/2015

## 2015-07-01 NOTE — Addendum Note (Signed)
Addended by: Joaquin Music on: 07/01/2015 09:41 AM   Modules accepted: Orders

## 2015-07-02 LAB — CBC WITH DIFFERENTIAL/PLATELET
BASOS ABS: 0 10*3/uL (ref 0.0–0.1)
Basophils Relative: 0 % (ref 0–1)
EOS ABS: 0.1 10*3/uL (ref 0.0–0.7)
EOS PCT: 1 % (ref 0–5)
HCT: 41.4 % (ref 36.0–46.0)
Hemoglobin: 13.8 g/dL (ref 12.0–15.0)
LYMPHS PCT: 28 % (ref 12–46)
Lymphs Abs: 2.6 10*3/uL (ref 0.7–4.0)
MCH: 30.3 pg (ref 26.0–34.0)
MCHC: 33.3 g/dL (ref 30.0–36.0)
MCV: 90.8 fL (ref 78.0–100.0)
MPV: 12.3 fL (ref 8.6–12.4)
Monocytes Absolute: 0.5 10*3/uL (ref 0.1–1.0)
Monocytes Relative: 5 % (ref 3–12)
NEUTROS PCT: 66 % (ref 43–77)
Neutro Abs: 6.1 10*3/uL (ref 1.7–7.7)
PLATELETS: 253 10*3/uL (ref 150–400)
RBC: 4.56 MIL/uL (ref 3.87–5.11)
RDW: 12.8 % (ref 11.5–15.5)
WBC: 9.3 10*3/uL (ref 4.0–10.5)

## 2015-07-02 LAB — COMPREHENSIVE METABOLIC PANEL
ALK PHOS: 46 U/L (ref 33–115)
ALT: 16 U/L (ref 6–29)
AST: 20 U/L (ref 10–30)
Albumin: 3.9 g/dL (ref 3.6–5.1)
BILIRUBIN TOTAL: 0.6 mg/dL (ref 0.2–1.2)
BUN: 15 mg/dL (ref 7–25)
CO2: 26 mmol/L (ref 20–31)
CREATININE: 0.84 mg/dL (ref 0.50–1.10)
Calcium: 9 mg/dL (ref 8.6–10.2)
Chloride: 101 mmol/L (ref 98–110)
GLUCOSE: 79 mg/dL (ref 65–99)
Potassium: 3.8 mmol/L (ref 3.5–5.3)
Sodium: 139 mmol/L (ref 135–146)
Total Protein: 6.4 g/dL (ref 6.1–8.1)

## 2015-07-02 LAB — THYROID PANEL
FREE T4: 0.95 ng/dL (ref 0.80–1.80)
T3 Uptake: 26 % (ref 22–35)
T4, Total: 9.5 ug/dL (ref 4.5–12.0)
TSH: 1.865 u[IU]/mL (ref 0.350–4.500)

## 2015-07-02 LAB — AMYLASE: AMYLASE: 73 U/L (ref 0–105)

## 2015-07-02 LAB — SEDIMENTATION RATE: Sed Rate: 4 mm/hr (ref 0–20)

## 2015-07-02 LAB — ANA: ANA: NEGATIVE

## 2015-07-02 LAB — FOLLICLE STIMULATING HORMONE: FSH: 4.9 m[IU]/mL

## 2015-07-21 ENCOUNTER — Other Ambulatory Visit: Payer: Self-pay | Admitting: *Deleted

## 2015-07-21 ENCOUNTER — Other Ambulatory Visit: Payer: Commercial Managed Care - PPO

## 2015-07-21 ENCOUNTER — Ambulatory Visit (INDEPENDENT_AMBULATORY_CARE_PROVIDER_SITE_OTHER): Payer: Commercial Managed Care - PPO | Admitting: Gynecology

## 2015-07-21 ENCOUNTER — Encounter: Payer: Self-pay | Admitting: Gynecology

## 2015-07-21 ENCOUNTER — Ambulatory Visit (INDEPENDENT_AMBULATORY_CARE_PROVIDER_SITE_OTHER): Payer: Commercial Managed Care - PPO

## 2015-07-21 VITALS — BP 118/78 | Ht 64.0 in | Wt 129.0 lb

## 2015-07-21 DIAGNOSIS — R102 Pelvic and perineal pain: Secondary | ICD-10-CM

## 2015-07-21 DIAGNOSIS — R61 Generalized hyperhidrosis: Secondary | ICD-10-CM

## 2015-07-21 DIAGNOSIS — R1032 Left lower quadrant pain: Secondary | ICD-10-CM | POA: Diagnosis not present

## 2015-07-21 NOTE — Patient Instructions (Signed)
Try eating complex carbohydrates before you go to bed to see if this does not help with the sweating. If it continues call me and I will have you be seen by an endocrinologist.

## 2015-07-21 NOTE — Progress Notes (Signed)
AROURA APPLEYARD October 15, 1975 [id number]        39 y.o.  [id number] Presents for follow up ultrasound.  History of excessive sweating at night. Also some ill-defined lower abdominal discomfort. Lab work was all normal to include comprehensive metabolic panel CBC and sed rate ANA FSH TSH amylase.  Past medical history,surgical history, problem list, medications, allergies, family history and social history were all reviewed and documented in the EPIC chart.  Directed ROS with pertinent positives and negatives documented in the history of present illness/assessment and plan.  Exam: Filed Vitals:   07/21/15 1639  BP: 118/78  Height: 5\' 4"  (1.626 m)  Weight: 129 lb (58.514 kg)   General appearance:  Normal  Ultrasound shows uterus normal size and echotexture. Endometrial echo 1.1 mm. Right and left ovaries normal. Cul-de-sac negative  Assessment/Plan:  39 y.o. [id number] with history as above. Reviewed with patient she possibly could becoming hypoglycemic at night which could account for the sweating. Recommended eating complex carbohydrates before bedtime to see if this doesn't help. If this continues she knows to call me and I'm going to have her seen by an endocrinologist.    [name] MD, 4:53 PM 07/21/2015

## 2015-07-22 LAB — H. PYLORI BREATH TEST: H. pylori Breath Test: NOT DETECTED

## 2015-11-19 ENCOUNTER — Telehealth: Payer: Self-pay | Admitting: *Deleted

## 2015-11-19 NOTE — Telephone Encounter (Signed)
Unable to reach patient at time of pre-visit call. Left message for patient to return call when available.  

## 2015-11-22 ENCOUNTER — Ambulatory Visit (INDEPENDENT_AMBULATORY_CARE_PROVIDER_SITE_OTHER): Payer: Commercial Managed Care - PPO | Admitting: Family Medicine

## 2015-11-22 ENCOUNTER — Encounter: Payer: Commercial Managed Care - PPO | Admitting: Family Medicine

## 2015-11-22 ENCOUNTER — Encounter: Payer: Self-pay | Admitting: Family Medicine

## 2015-11-22 VITALS — BP 98/70 | HR 60 | Temp 98.3°F | Ht 64.3 in | Wt 132.4 lb

## 2015-11-22 DIAGNOSIS — Z131 Encounter for screening for diabetes mellitus: Secondary | ICD-10-CM | POA: Diagnosis not present

## 2015-11-22 DIAGNOSIS — F410 Panic disorder [episodic paroxysmal anxiety] without agoraphobia: Secondary | ICD-10-CM | POA: Diagnosis not present

## 2015-11-22 DIAGNOSIS — Z13220 Encounter for screening for lipoid disorders: Secondary | ICD-10-CM

## 2015-11-22 DIAGNOSIS — Z0000 Encounter for general adult medical examination without abnormal findings: Secondary | ICD-10-CM | POA: Diagnosis not present

## 2015-11-22 LAB — LIPID PANEL
CHOLESTEROL: 182 mg/dL (ref 0–200)
HDL: 72.2 mg/dL (ref >39.00)
LDL Cholesterol: 92 mg/dL (ref 0–99)
NONHDL: 109.73
Total CHOL/HDL Ratio: 3
Triglycerides: 87 mg/dL (ref 0.0–149.0)
VLDL: 17.4 mg/dL (ref 0.0–40.0)

## 2015-11-22 LAB — HEMOGLOBIN A1C: HEMOGLOBIN A1C: 5.5 % (ref 4.6–6.5)

## 2015-11-22 MED ORDER — ALPRAZOLAM 0.25 MG PO TABS: 0.2500 mg | ORAL_TABLET | Freq: Two times a day (BID) | ORAL | Status: DC | PRN

## 2015-11-22 NOTE — Progress Notes (Signed)
[facility] at [facility] [address] [place], [place] [address] [phone]  Date:  11/22/2015   Name:  Kelly Stanley   DOB:  1976/04/09   MRN:  [id number]  PCP:  Annye Asa, MD    Chief Complaint: Annual Exam   History of Present Illness:  Kelly Stanley is a 40 y.o. very pleasant female patient who presents with the following:  Generally healthy woman here today for a CPE.  She was last seen here about one year ago.   She sees Dr. Phineas Real for her GYN needs.  Her pap is coming due in May. He has been helping her with some excessive sweating issues but all of her labs are normal so it seems to be idopathic  Her TSH was also normal.    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   She has 2 children- ages 36 and 57.   Married.   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    Wt Readings from Last 3 Encounters:  11/22/15 132 lb 6.4 oz (60.056 kg)  07/21/15 129 lb (58.514 kg)  12/31/14 129 lb (58.514 kg)   She is otherwise feeling well besides the sweating.  She has noted some "swelling in lymph nodes under my arms" over the last couple of weeks. This seems to be come and go and ?be related to exercise. She did have a mammogram some years ago due to a mass but his was benign  She was a pt at [employer] in [place] before they moved to [place]- she thinks that she had a tetanus there during her last pregnancy 6 years ago  She rarely uses a xanax but does like to keep a little bit on hand in case she needs it.    She is still on zoloft and feels like it does well in controlling her anxiety and panic attacks.  This is rx by Dr. Phineas Real  BP Readings from Last 3 Encounters:  11/22/15 98/70  07/21/15 118/78  07/01/15 116/76    Patient Active Problem List   Diagnosis Date Noted  . 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 Acute serous otitis media 10/02/2014  . Rash and nonspecific skin eruption 05/13/2014  . Allergic contact dermatitis 01/26/2014  . Anxiety and depression 11/17/2013  . Chronic night sweats 11/17/2013  . Family history of ovarian cancer 11/17/2013  . Allergic rhinitis 11/17/2013  . Orthostatic hypotension 11/17/2013    Past Medical History  Diagnosis Date  . Syncope   . Anxiety     Past Surgical History  Procedure Laterality Date  . Knee arthroscopy w/ lateral release Left 1997  . Appendectomy      Social History  Substance Use Topics  . Smoking status: Never Smoker   . Smokeless tobacco: Never Used  . Alcohol Use: 0.0 oz/week    0 Standard drinks or equivalent per week     Comment: Occas    Family History  Problem Relation Age of Onset  . Cancer Mother     ovarian  . Stroke Maternal Grandfather   . Diabetes Maternal Grandfather     Allergies  Allergen Reactions  . Codeine Nausea And Vomiting    Medication list has been reviewed and updated.  Current Outpatient Prescriptions on  File Prior to Visit  Medication Sig Dispense Refill  . ALPRAZolam (XANAX) 0.25 MG tablet Take 1 tablet (0.25 mg total) by mouth 2 (two) times daily as needed for anxiety. 20 tablet 0  . desogestrel-ethinyl estradiol (AZURETTE) 0.15-0.02/0.01 MG (21/5) tablet Take 1 tablet by mouth daily. 1 Package 12  . Multiple Vitamin (MULTIVITAMIN) tablet Take 1 tablet by mouth daily.    . sertraline (ZOLOFT) 100 MG tablet Take 0.5 tablets (50 mg total) by mouth daily. 30 tablet 12  . cetirizine (ZYRTEC) 10 MG tablet Take 10 mg by mouth daily. Reported on 11/22/2015     No current facility-administered medications on file prior to visit.    Review of Systems:  As per HPI- otherwise negative.   Physical Examination: Filed Vitals:   11/22/15 0835  BP: 98/70  Pulse: 60  Temp: 98.3 F (36.8 C)   Filed Vitals:   11/22/15 0835  Height: 5' 4.3" (1.633 m)  Weight: 132 lb  6.4 oz (60.056 kg)   Body mass index is 22.52 kg/(m^2). Ideal Body Weight: Weight in (lb) to have BMI = 25: 146.7  GEN: WDWN, NAD, Non-toxic, A & O x 3, slim. fit build, looks well HEENT: Atraumatic, Normocephalic. Neck supple. No masses, No LAD.  Bilateral TM wnl, oropharynx normal.  PEERL,EOMI.   Ears and Nose: No external deformity. CV: RRR, No M/G/R. No JVD. No thrill. No extra heart sounds. PULM: CTA B, no wheezes, crackles, rhonchi. No retractions. No resp. distress. No accessory muscle use. ABD: S, NT, ND, +BS. No rebound. No HSM. EXTR: No c/c/e NEURO Normal gait.  PSYCH: Normally interactive. Conversant. Not depressed or anxious appearing.  Calm demeanor.  Breast: normal exam, no masses/ dimpling/ discharge. No axillary changes   Assessment and Plan: Physical exam  Panic attacks - Plan: ALPRAZolam (XANAX) 0.25 MG tablet  Screening for hyperlipidemia - Plan: Lipid panel  Screening for diabetes mellitus - Plan: Hemoglobin A1c  Generally healthy lady here for a CPE Labs pending as above We called her OBG and they do NOT have a record of Tdap- sent her a mychart message and asked her to update this soon I do not appreciate any axillary masses but he will mention this to Dr. Phineas Real and have a mammo if any concern Refilled her xanax which she uses on very rare occasion for panic attack- she more just likes to have it available for a sense of security   Signed Lamar Blinks, MD

## 2015-11-22 NOTE — Patient Instructions (Signed)
It was very nice to meet you today!  Take care and I will be in touch with your labs asap I do not currently appreciate any abnormality in your armpit area.   However please do be sure to have Dr. Phineas Real examine this as well and have a mammogram if there is any question

## 2015-11-22 NOTE — Progress Notes (Signed)
Pre Visit review using our clinic review tool, if applicable. No additional management support is needed unless otherwise documented below in the visit.

## 2015-12-06 ENCOUNTER — Other Ambulatory Visit: Payer: Self-pay | Admitting: Family Medicine

## 2015-12-31 ENCOUNTER — Other Ambulatory Visit: Payer: Self-pay | Admitting: Gynecology

## 2016-01-18 ENCOUNTER — Other Ambulatory Visit: Payer: Self-pay | Admitting: Gynecology

## 2016-01-28 ENCOUNTER — Other Ambulatory Visit: Payer: Self-pay | Admitting: Gynecology

## 2016-03-03 ENCOUNTER — Ambulatory Visit (INDEPENDENT_AMBULATORY_CARE_PROVIDER_SITE_OTHER): Payer: Commercial Managed Care - PPO | Admitting: Gynecology

## 2016-03-03 ENCOUNTER — Encounter: Payer: Self-pay | Admitting: Gynecology

## 2016-03-03 VITALS — BP 116/74 | Ht 65.0 in | Wt 132.0 lb

## 2016-03-03 DIAGNOSIS — Z01419 Encounter for gynecological examination (general) (routine) without abnormal findings: Secondary | ICD-10-CM

## 2016-03-03 DIAGNOSIS — Z23 Encounter for immunization: Secondary | ICD-10-CM | POA: Diagnosis not present

## 2016-03-03 MED ORDER — DESOGESTREL-ETHINYL ESTRADIOL 0.15-0.02/0.01 MG (21/5) PO TABS: 1.0000 | ORAL_TABLET | Freq: Every day | ORAL | Status: DC

## 2016-03-03 MED ORDER — SERTRALINE HCL 100 MG PO TABS: ORAL_TABLET | ORAL | Status: DC

## 2016-03-03 NOTE — Addendum Note (Signed)
Addended by: Nelva Nay on: 03/03/2016 12:01 PM   Modules accepted: Orders, SmartSet

## 2016-03-03 NOTE — Progress Notes (Signed)
    Kelly Stanley 1976-05-17 [id number]        40 y.o.  [id number]  for annual exam.  Doing well without complaints.  Past medical history,surgical history, problem list, medications, allergies, family history and social history were all reviewed and documented as reviewed in the EPIC chart.  ROS:  Performed with pertinent positives and negatives included in the history, assessment and plan.   Additional significant findings :  None   Exam: Kelly Stanley assistant Filed Vitals:   03/03/16 1124  BP: 116/74  Height: _0  (1.651 m)  Weight: 132 lb (59.875 kg)   General appearance:  Normal affect, orientation and appearance. Skin: Grossly normal HEENT: Without gross lesions.  No cervical or supraclavicular adenopathy. Thyroid normal.  Lungs:  Clear without wheezing, rales or rhonchi Cardiac: RR, without RMG Abdominal:  Soft, nontender, without masses, guarding, rebound, organomegaly or hernia Breasts:  Examined lying and sitting without masses, retractions, discharge or axillary adenopathy. Pelvic:  Ext/BUS/Vagina with tail end of menses  Cervix normal  Uterus anteverted, normal size, shape and contour, midline and mobile nontender   Adnexa without masses or tenderness    Anus and perineum normal   Rectovaginal normal sphincter tone without palpated masses or tenderness.    Assessment/Plan:  40 y.o. [name] female for annual exam with regular menses, oral contraceptives.   1. Oral contraceptives. Patient continues on her low-dose desogestrel pill. Doing well and wants to continue. Not being followed for medical issues and never smoked. Risks have been discussed multiple times to include stroke heart attack DVT. Patient's comfortable continuing and refill 1 year provided. 2. Maternal history of serous ovarian cancer diagnosed 2000 with patient passing away 2014. Negative BRCA testing historically. No other family member with breast, colon, ovarian or uterine cancer. We had discussed  serial ultrasound screening or CA-125 and she has declined screening. 3. Anxiety. Patient continues on Zoloft 50 mg daily. Refill 1 year provided. Uses Xanax when necessary but has supply at home. Will call if needs more. 4. Pap smear/HPV 2015 negative. No Pap smear done today. History LGSIL 2005 through 2007 with negative colposcopy. Pap smears normal since then. 5. Mammography 2014. Plan follow up mammogram at age 40. SBE monthly reviewed. 6. Health maintenance. No routine lab work done as patient does this elsewhere. Follow up 1 year, sooner as needed.   [name] MD, 11:49 AM 03/03/2016

## 2016-03-03 NOTE — Patient Instructions (Signed)

## 2016-03-27 ENCOUNTER — Other Ambulatory Visit: Payer: Self-pay | Admitting: Gynecology

## 2016-09-25 DIAGNOSIS — J069 Acute upper respiratory infection, unspecified: Secondary | ICD-10-CM | POA: Diagnosis not present

## 2017-02-21 ENCOUNTER — Other Ambulatory Visit: Payer: Self-pay | Admitting: Gynecology

## 2017-02-26 DIAGNOSIS — L821 Other seborrheic keratosis: Secondary | ICD-10-CM | POA: Diagnosis not present

## 2017-02-26 DIAGNOSIS — L814 Other melanin hyperpigmentation: Secondary | ICD-10-CM | POA: Diagnosis not present

## 2017-02-26 DIAGNOSIS — D225 Melanocytic nevi of trunk: Secondary | ICD-10-CM | POA: Diagnosis not present

## 2017-03-30 NOTE — Progress Notes (Addendum)
[facility] [address] [place], [place] [address] [phone]  Date:  04/02/2017   Name:  Kelly Stanley   DOB:  10-Jan-1976   MRN:  [id number]  PCP:  Midge Minium, MD    Chief Complaint: Annual Exam   History of Present Illness:  Kelly Stanley is a 41 y.o. very pleasant female patient who presents with the following:  History of concussion, depression/ anxiety. Here today for a CPE Last seen by myself in spring of 2017:  Generally healthy woman here today for a CPE.  She was last seen here about one year ago.   She sees Dr. Phineas Real for her GYN needs.  Her pap is coming due in May. He has been helping her with some excessive sweating issues but all of her labs are normal so it seems to be idopathic  Her TSH was also normal.    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   She has 2 children- ages 58 and 55.   Married.   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    She is likely due for labs today She is going back to work tomorrow.  She had a good summer Her children are 35 and 62 yo- they will both be at the same school next year  Looks like her last pap was in 2015- she will be due this year, will schedule with Dr. Phineas Real She is fasting today- she is very hungry!  She will plan to start routine mammograms this year  She uses her xanax on extremely rare occasion- still has her bottle from last year  She is on zoloft 50 a day  Noted that her heart rate is slow today- pt does exercise and run quite a bit.  She notes that she can sometimes feel dizzy if she stands up fast- however this has been a lifelong issue for her She drinks gatorade and will eat salty foods if craved after exercise She does wear a smart watch and has not noted pulse lower than the 50s at home    BP Readings from Last 3 Encounters:  04/02/17 129/77  03/03/16 116/74  11/22/15 98/70   Pulse Readings from Last 3 Encounters:  04/02/17 70  11/22/15 60  11/19/14 81    Patient Active Problem List   Diagnosis Date Noted  . 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 Allergic contact dermatitis 01/26/2014  . Anxiety and depression 11/17/2013  . Chronic night sweats 11/17/2013  . Family history of ovarian cancer 11/17/2013  . Allergic rhinitis 11/17/2013  . Orthostatic hypotension 11/17/2013    Past Medical History:  Diagnosis Date  . Anxiety   . Syncope     Past Surgical History:  Procedure Laterality Date  . APPENDECTOMY    . KNEE ARTHROSCOPY W/ LATERAL RELEASE Left 1997    Social History  Substance Use Topics  . Smoking status: Never Smoker  . Smokeless tobacco: Never Used  . Alcohol use 0.0 oz/week     Comment: Occas    Family History  Problem Relation Age of Onset  . Cancer Mother        ovarian  . Stroke Maternal Grandfather   . Diabetes Maternal Grandfather     Allergies  Allergen Reactions  . Codeine Nausea And Vomiting    Medication list has been reviewed and updated.  Current Outpatient Prescriptions on File Prior to Visit  Medication Sig Dispense Refill  . ALPRAZolam (XANAX) 0.25 MG tablet Take 1 tablet (0.25 mg total) by mouth 2 (two) times daily as needed for anxiety. 20 tablet 0  . AZURETTE 0.15-0.02/0.01 MG (21/5) tablet TAKE 1 TABLET BY MOUTH DAILY 28 tablet 11  . sertraline (ZOLOFT) 100 MG tablet TAKE 1/2 TABLET(50 MG) BY MOUTH DAILY 30 tablet 12  . Multiple Vitamin (MULTIVITAMIN) tablet Take 1 tablet by mouth daily.     No current facility-administered medications on file prior to visit.     Review of Systems:  As per HPI- otherwise negative.   Physical Examination: Vitals:   04/02/17 1204 04/02/17 1227  BP: 129/77   Pulse: (!) 45 70  Temp: 98.1 F (36.7 C)   SpO2: 100%    Vitals:   04/02/17 1204  Weight: 132 lb (59.9  kg)  Height: 5\' 5"  (1.651 m)   Body mass index is 21.97 kg/m. Ideal Body Weight: Weight in (lb) to have BMI = 25: 149.9  GEN: WDWN, NAD, Non-toxic, A & O x 3, fit, athletic build.   HEENT: Atraumatic, Normocephalic. Neck supple. No masses, No LAD.  Bilateral TM wnl, oropharynx normal.  PEERL,EOMI.   Ears and Nose: No external deformity. CV: RRR, No M/G/R. No JVD. No thrill. No extra heart sounds. PULM: CTA B, no wheezes, crackles, rhonchi. No retractions. No resp. distress. No accessory muscle use. ABD: S, NT, ND, +BS. No rebound. No HSM. EXTR: No c/c/e NEURO Normal gait.  PSYCH: Normally interactive. Conversant. Not depressed or anxious appearing.  Calm demeanor.    Assessment and Plan: Physical exam  Screening for hyperlipidemia - Plan: Lipid panel  Screening for diabetes mellitus - Plan: Comprehensive metabolic panel  Screening for deficiency anemia - Plan: CBC  Bradycardia - Plan: TSH  Vitamin D deficiency - Plan: Vitamin D (25 hydroxy)  Here today for a CPE generally in excellent health  Pulse counted in the 40s on rooming.  I recounted in the mid 22s, and then we had her do 10 squats, pulse to 70.  She has not noted any severe sx but will keep an eye on this for me, will monitor her pulse on her watch Otherwise labs pending as above She will start mammograms and continue routine pap with her GYN provider  Tetanus is UTD   Signed Lamar Blinks, MD  Labs as below: Results for orders placed or performed in visit on 04/02/17  CBC  Result Value Ref Range   WBC 6.9 4.0 - 10.5 K/uL   RBC 4.24 3.87 - 5.11 Mil/uL   Platelets 216.0 150.0 - 400.0 K/uL   Hemoglobin 13.4 12.0 - 15.0 g/dL   HCT 39.8 36.0 - 46.0 %   MCV 93.7 78.0 - 100.0 fl   MCHC 33.6 30.0 - 36.0 g/dL   RDW 12.4 11.5 - 15.5 %  Comprehensive metabolic panel  Result Value Ref Range   Sodium 139 135 - 145 mEq/L   Potassium 3.8 3.5 - 5.1 mEq/L   Chloride 102 96 - 112 mEq/L   CO2 28 19 - 32 mEq/L    Glucose, Bld 120 (H) 70 - 99 mg/dL   BUN 11 6 - 23 mg/dL   Creatinine, Ser 0.80 0.40 - 1.20 mg/dL   Total Bilirubin 0.7 0.2 - 1.2 mg/dL   Alkaline Phosphatase 43  39 - 117 U/L   AST 19 0 - 37 U/L   ALT 15 0 - 35 U/L   Total Protein 6.2 6.0 - 8.3 g/dL   Albumin 3.9 3.5 - 5.2 g/dL   Calcium 9.1 8.4 - 10.5 mg/dL   GFR 84.50 >60.00 mL/min  Lipid panel  Result Value Ref Range   Cholesterol 175 0 - 200 mg/dL   Triglycerides 89.0 0.0 - 149.0 mg/dL   HDL 70.60 >39.00 mg/dL   VLDL 17.8 0.0 - 40.0 mg/dL   LDL Cholesterol 86 0 - 99 mg/dL   Total CHOL/HDL Ratio 2    NonHDL 103.93   TSH  Result Value Ref Range   TSH 2.11 0.35 - 4.50 uIU/mL  Vitamin D (25 hydroxy)  Result Value Ref Range   VITD 50.52 30.00 - 100.00 ng/mL   Message to pt

## 2017-04-02 ENCOUNTER — Encounter: Payer: Self-pay | Admitting: Family Medicine

## 2017-04-02 ENCOUNTER — Ambulatory Visit (INDEPENDENT_AMBULATORY_CARE_PROVIDER_SITE_OTHER): Payer: Commercial Managed Care - PPO | Admitting: Family Medicine

## 2017-04-02 VITALS — BP 129/77 | HR 70 | Temp 98.1°F | Ht 65.0 in | Wt 132.0 lb

## 2017-04-02 DIAGNOSIS — Z130 Encounter for screening for diseases of the blood and blood-forming organs and certain disorders involving the immune mechanism: Secondary | ICD-10-CM

## 2017-04-02 DIAGNOSIS — Z131 Encounter for screening for diabetes mellitus: Secondary | ICD-10-CM

## 2017-04-02 DIAGNOSIS — Z13220 Encounter for screening for lipoid disorders: Secondary | ICD-10-CM | POA: Diagnosis not present

## 2017-04-02 DIAGNOSIS — R001 Bradycardia, unspecified: Secondary | ICD-10-CM | POA: Diagnosis not present

## 2017-04-02 DIAGNOSIS — E559 Vitamin D deficiency, unspecified: Secondary | ICD-10-CM | POA: Diagnosis not present

## 2017-04-02 DIAGNOSIS — Z0000 Encounter for general adult medical examination without abnormal findings: Secondary | ICD-10-CM

## 2017-04-02 LAB — COMPREHENSIVE METABOLIC PANEL
ALBUMIN: 3.9 g/dL (ref 3.5–5.2)
ALK PHOS: 43 U/L (ref 39–117)
ALT: 15 U/L (ref 0–35)
AST: 19 U/L (ref 0–37)
BILIRUBIN TOTAL: 0.7 mg/dL (ref 0.2–1.2)
BUN: 11 mg/dL (ref 6–23)
CALCIUM: 9.1 mg/dL (ref 8.4–10.5)
CO2: 28 meq/L (ref 19–32)
Chloride: 102 mEq/L (ref 96–112)
Creatinine, Ser: 0.8 mg/dL (ref 0.40–1.20)
GFR: 84.5 mL/min (ref >60.00)
GLUCOSE: 120 mg/dL — AB (ref 70–99)
POTASSIUM: 3.8 meq/L (ref 3.5–5.1)
SODIUM: 139 meq/L (ref 135–145)
Total Protein: 6.2 g/dL (ref 6.0–8.3)

## 2017-04-02 LAB — CBC
HCT: 39.8 % (ref 36.0–46.0)
Hemoglobin: 13.4 g/dL (ref 12.0–15.0)
MCHC: 33.6 g/dL (ref 30.0–36.0)
MCV: 93.7 fl (ref 78.0–100.0)
PLATELETS: 216 10*3/uL (ref 150.0–400.0)
RBC: 4.24 Mil/uL (ref 3.87–5.11)
RDW: 12.4 % (ref 11.5–15.5)
WBC: 6.9 10*3/uL (ref 4.0–10.5)

## 2017-04-02 LAB — VITAMIN D 25 HYDROXY (VIT D DEFICIENCY, FRACTURES): VITD: 50.52 ng/mL (ref 30.00–100.00)

## 2017-04-02 LAB — LIPID PANEL
CHOL/HDL RATIO: 2
Cholesterol: 175 mg/dL (ref 0–200)
HDL: 70.6 mg/dL (ref >39.00)
LDL Cholesterol: 86 mg/dL (ref 0–99)
NONHDL: 103.93
TRIGLYCERIDES: 89 mg/dL (ref 0.0–149.0)
VLDL: 17.8 mg/dL (ref 0.0–40.0)

## 2017-04-02 LAB — TSH: TSH: 2.11 u[IU]/mL (ref 0.35–4.50)

## 2017-04-02 NOTE — Patient Instructions (Addendum)
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  I will be in touch with your labs, and please do start getting routine mammograms this year  Your heart rate is a bit slow- this is likely due to your active lifestyle and also just your personal tendencies.  However if you notice your heart rate being in the 40s on a regular basis, or if you have worsening symptoms please let me know   Health Maintenance, Female Adopting a healthy lifestyle and getting preventive care can go a long way to promote health and wellness. Talk with your health care provider about what schedule of regular examinations is right for you. This is a good chance for you to check in with your provider about disease prevention and staying healthy. In between checkups, there are plenty of things you can do on your own. Experts have done a lot of research about which lifestyle changes and preventive measures are most likely to keep you healthy. Ask your health care provider for more information. Weight and diet Eat a healthy diet  Be sure to include plenty of vegetables, fruits, low-fat dairy products, and lean protein.  Do not eat a lot of foods high in solid fats, added sugars, or salt.  Get regular exercise. This is one of the most important things you can do for your health. ? Most adults should exercise for at least 150 minutes each week. The exercise should increase your heart rate and make you sweat (moderate-intensity exercise). ? Most adults should also do strengthening exercises at least twice a week. This is in addition to the moderate-intensity exercise.  Maintain a healthy weight  Body mass index (BMI) is a measurement that can be used to identify possible weight problems. It estimates body fat based on height and weight. Your health care provider can help determine your BMI and help you achieve or maintain a healthy weight.  For females 40 years of age and older: ? A BMI below 18.5 is considered  underweight. ? A BMI of 18.5 to 24.9 is normal. ? A BMI of 25 to 29.9 is considered overweight. ? A BMI of 30 and above is considered obese.  Watch levels of cholesterol and blood lipids  You should start having your blood tested for lipids and cholesterol at 40 years of age, then have this test every 5 years.  You may need to have your cholesterol levels checked more often if: ? Your lipid or cholesterol levels are high. ? You are older than 40 years of age. ? You are at high risk for heart disease.  Cancer screening Lung Cancer  Lung cancer screening is recommended for adults 40-40 years old who are at high risk for lung cancer because of a history of smoking.  A yearly low-dose CT scan of the lungs is recommended for people who: ? Currently smoke. ? Have quit within the past 15 years. ? Have at least a 30-pack-year history of smoking. A pack year is smoking an average of one pack of cigarettes a day for 1 year.  Yearly screening should continue until it has been 15 years since you quit.  Yearly screening should stop if you develop a health problem that would prevent you from having lung cancer treatment.  Breast Cancer  Practice breast self-awareness. This means understanding how your breasts normally appear and feel.  It also means doing regular breast self-exams. Let your health care provider know about any changes, no matter  how small.  If you are in your 20s or 30s, you should have a clinical breast exam (CBE) by a health care provider every 1-3 years as part of a regular health exam.  If you are 40 or older, have a CBE every year. Also consider having a breast X-ray (mammogram) every year.  If you have a family history of breast cancer, talk to your health care provider about genetic screening.  If you are at high risk for breast cancer, talk to your health care provider about having an MRI and a mammogram every year.  Breast cancer gene (BRCA) assessment is  recommended for women who have family members with BRCA-related cancers. BRCA-related cancers include: ? Breast. ? Ovarian. ? Tubal. ? Peritoneal cancers.  Results of the assessment will determine the need for genetic counseling and BRCA1 and BRCA2 testing.  Cervical Cancer Your health care provider may recommend that you be screened regularly for cancer of the pelvic organs (ovaries, uterus, and vagina). This screening involves a pelvic examination, including checking for microscopic changes to the surface of your cervix (Pap test). You may be encouraged to have this screening done every 3 years, beginning at age 40.  For women ages 21-65, health care providers may recommend pelvic exams and Pap testing every 3 years, or they may recommend the Pap and pelvic exam, combined with testing for human papilloma virus (HPV), every 5 years. Some types of HPV increase your risk of cervical cancer. Testing for HPV may also be done on women of any age with unclear Pap test results.  Other health care providers may not recommend any screening for nonpregnant women who are considered low risk for pelvic cancer and who do not have symptoms. Ask your health care provider if a screening pelvic exam is right for you.  If you have had past treatment for cervical cancer or a condition that could lead to cancer, you need Pap tests and screening for cancer for at least 20 years after your treatment. If Pap tests have been discontinued, your risk factors (such as having a new sexual partner) need to be reassessed to determine if screening should resume. Some women have medical problems that increase the chance of getting cervical cancer. In these cases, your health care provider may recommend more frequent screening and Pap tests.  Colorectal Cancer  This type of cancer can be detected and often prevented.  Routine colorectal cancer screening usually begins at 40 years of age and continues through 40 years of  age.  Your health care provider may recommend screening at an earlier age if you have risk factors for colon cancer.  Your health care provider may also recommend using home test kits to check for hidden blood in the stool.  A small camera at the end of a tube can be used to examine your colon directly (sigmoidoscopy or colonoscopy). This is done to check for the earliest forms of colorectal cancer.  Routine screening usually begins at age 44.  Direct examination of the colon should be repeated every 5-10 years through 40 years of age. However, you may need to be screened more often if early forms of precancerous polyps or small growths are found.  Skin Cancer  Check your skin from head to toe regularly.  Tell your health care provider about any new moles or changes in moles, especially if there is a change in a mole's shape or color.  Also tell your health care provider if you have a  mole that is larger than the size of a pencil eraser.  Always use sunscreen. Apply sunscreen liberally and repeatedly throughout the day.  Protect yourself by wearing long sleeves, pants, a wide-brimmed hat, and sunglasses whenever you are outside.  Heart disease, diabetes, and high blood pressure  High blood pressure causes heart disease and increases the risk of stroke. High blood pressure is more likely to develop in: ? People who have blood pressure in the high end of the normal range (130-139/85-89 mm Hg). ? People who are overweight or obese. ? People who are African American.  If you are 34-8 years of age, have your blood pressure checked every 3-5 years. If you are 49 years of age or older, have your blood pressure checked every year. You should have your blood pressure measured twice-once when you are at a hospital or clinic, and once when you are not at a hospital or clinic. Record the average of the two measurements. To check your blood pressure when you are not at a hospital or clinic, you  can use: ? An automated blood pressure machine at a pharmacy. ? A home blood pressure monitor.  If you are between 3 years and 41 years old, ask your health care provider if you should take aspirin to prevent strokes.  Have regular diabetes screenings. This involves taking a blood sample to check your fasting blood sugar level. ? If you are at a normal weight and have a low risk for diabetes, have this test once every three years after 40 years of age. ? If you are overweight and have a high risk for diabetes, consider being tested at a younger age or more often. Preventing infection Hepatitis B  If you have a higher risk for hepatitis B, you should be screened for this virus. You are considered at high risk for hepatitis B if: ? You were born in a country where hepatitis B is common. Ask your health care provider which countries are considered high risk. ? Your parents were born in a high-risk country, and you have not been immunized against hepatitis B (hepatitis B vaccine). ? You have HIV or AIDS. ? You use needles to inject street drugs. ? You live with someone who has hepatitis B. ? You have had sex with someone who has hepatitis B. ? You get hemodialysis treatment. ? You take certain medicines for conditions, including cancer, organ transplantation, and autoimmune conditions.  Hepatitis C  Blood testing is recommended for: ? Everyone born from [birth year] through 1964. ? Anyone with known risk factors for hepatitis C.  Sexually transmitted infections (STIs)  You should be screened for sexually transmitted infections (STIs) including gonorrhea and chlamydia if: ? You are sexually active and are younger than 40 years of age. ? You are older than 40 years of age and your health care provider tells you that you are at risk for this type of infection. ? Your sexual activity has changed since you were last screened and you are at an increased risk for chlamydia or gonorrhea. Ask your  health care provider if you are at risk.  If you do not have HIV, but are at risk, it may be recommended that you take a prescription medicine daily to prevent HIV infection. This is called pre-exposure prophylaxis (PrEP). You are considered at risk if: ? You are sexually active and do not regularly use condoms or know the HIV status of your partner(s). ? You take drugs by injection. ? You  are sexually active with a partner who has HIV.  Talk with your health care provider about whether you are at high risk of being infected with HIV. If you choose to begin PrEP, you should first be tested for HIV. You should then be tested every 3 months for as long as you are taking PrEP. Pregnancy  If you are premenopausal and you may become pregnant, ask your health care provider about preconception counseling.  If you may become pregnant, take 400 to 800 micrograms (mcg) of folic acid every day.  If you want to prevent pregnancy, talk to your health care provider about birth control (contraception). Osteoporosis and menopause  Osteoporosis is a disease in which the bones lose minerals and strength with aging. This can result in serious bone fractures. Your risk for osteoporosis can be identified using a bone density scan.  If you are 49 years of age or older, or if you are at risk for osteoporosis and fractures, ask your health care provider if you should be screened.  Ask your health care provider whether you should take a calcium or vitamin D supplement to lower your risk for osteoporosis.  Menopause may have certain physical symptoms and risks.  Hormone replacement therapy may reduce some of these symptoms and risks. Talk to your health care provider about whether hormone replacement therapy is right for you. Follow these instructions at home:  Schedule regular health, dental, and eye exams.  Stay current with your immunizations.  Do not use any tobacco products including cigarettes, chewing  tobacco, or electronic cigarettes.  If you are pregnant, do not drink alcohol.  If you are breastfeeding, limit how much and how often you drink alcohol.  Limit alcohol intake to no more than 1 drink per day for nonpregnant women. One drink equals 12 ounces of beer, 5 ounces of wine, or 1 ounces of hard liquor.  Do not use street drugs.  Do not share needles.  Ask your health care provider for help if you need support or information about quitting drugs.  Tell your health care provider if you often feel depressed.  Tell your health care provider if you have ever been abused or do not feel safe at home. This information is not intended to replace advice given to you by your health care provider. Make sure you discuss any questions you have with your health care provider. Document Released: 02/20/2011 Document Revised: 01/13/2016 Document Reviewed: 05/11/2015 Elsevier Interactive Patient Education  [employer].

## 2017-04-21 ENCOUNTER — Other Ambulatory Visit: Payer: Self-pay | Admitting: Gynecology

## 2017-04-25 ENCOUNTER — Other Ambulatory Visit: Payer: Self-pay | Admitting: Gynecology

## 2017-04-25 DIAGNOSIS — Z1231 Encounter for screening mammogram for malignant neoplasm of breast: Secondary | ICD-10-CM

## 2017-05-14 ENCOUNTER — Ambulatory Visit (INDEPENDENT_AMBULATORY_CARE_PROVIDER_SITE_OTHER): Payer: Commercial Managed Care - PPO | Admitting: Gynecology

## 2017-05-14 ENCOUNTER — Encounter: Payer: Self-pay | Admitting: Gynecology

## 2017-05-14 VITALS — BP 116/70 | Ht 64.0 in | Wt 133.0 lb

## 2017-05-14 DIAGNOSIS — Z01419 Encounter for gynecological examination (general) (routine) without abnormal findings: Secondary | ICD-10-CM | POA: Diagnosis not present

## 2017-05-14 MED ORDER — SERTRALINE HCL 100 MG PO TABS: ORAL_TABLET | ORAL | 12 refills | Status: DC

## 2017-05-14 MED ORDER — DESOGESTREL-ETHINYL ESTRADIOL 0.15-0.02/0.01 MG (21/5) PO TABS: 1.0000 | ORAL_TABLET | Freq: Every day | ORAL | 12 refills | Status: DC

## 2017-05-14 NOTE — Patient Instructions (Signed)
Followup in one year for annual exam, sooner if any issues 

## 2017-05-14 NOTE — Progress Notes (Signed)
    ADRIEL DESROSIER 11-23-1975 [id number]        41 y.o.  [id number] for annual gynecologic exam.    Past medical history,surgical history, problem list, medications, allergies, family history and social history were all reviewed and documented as reviewed in the EPIC chart.  ROS:  Performed with pertinent positives and negatives included in the history, assessment and plan.   Additional significant findings :  None   Exam: [name] assistant Vitals:   05/14/17 1612  BP: 116/70  Weight: 133 lb (60.3 kg)  Height: 5\' 4"  (1.626 m)   Body mass index is 22.83 kg/m.  General appearance:  Normal affect, orientation and appearance. Skin: Grossly normal HEENT: Without gross lesions.  No cervical or supraclavicular adenopathy. Thyroid normal.  Lungs:  Clear without wheezing, rales or rhonchi Cardiac: RR, without RMG Abdominal:  Soft, nontender, without masses, guarding, rebound, organomegaly or hernia Breasts:  Examined lying and sitting without masses, retractions, discharge or axillary adenopathy. Pelvic:  Ext, BUS, Vagina: Normal  Cervix: Normal  Uterus: Anteverted, normal size, shape and contour, midline and mobile nontender   Adnexa: Without masses or tenderness    Anus and perineum: Normal excepting small external hemorrhoid  Rectovaginal: Normal sphincter tone without palpated masses or tenderness.    Assessment/Plan:  41 y.o. [id number] female for annual gynecologic exam with regular menses, oral contraceptives.   1. Continues on a dosogestrel pill. Doing well.  Wants to continue. We discussed multiple times the risks of thrombosis including stroke heart attack DVT and she is comfortable continuing. Does have a maternal history of ovarian cancer we reviewed the prophylactic benefits as far as that's concerned from an ovarian standpoint. Refill 1 year provided. 2. Maternal history of ovarian cancer 2000. Past away in 2014. Genetic testing reported negative. No other family members  with breast, colon, ovary or uterine cancer. We have discussed screening strategies and at this point she declines. 3. Anxiety. Continues on Zoloft 50 mg daily doing well. Was having panic attacks in used Xanax when necessary. Still has supply from last year as she has rarely used it. Will call if she needs more. Refill of Zoloft 1 year provided. 4. Pap smear/HPV 2015. No Pap smear done today. History of LGSIL 2005 through 2007 with negative colposcopy. Normal Pap smears since. 5. Mammography scheduled as patient turns 41 in October. Breast exam normal today. 6. Health maintenance. No routine lab work done as patient reports is done elsewhere. Follow up 1 year, sooner as needed.   [name] MD, 4:41 PM 05/14/2017

## 2017-07-03 ENCOUNTER — Ambulatory Visit
Admission: RE | Admit: 2017-07-03 | Discharge: 2017-07-03 | Disposition: A | Discharge status: Home / Self Care | Payer: Commercial Managed Care - PPO | Source: Ambulatory Visit | Attending: Gynecology | Admitting: Gynecology

## 2017-07-03 DIAGNOSIS — Z1231 Encounter for screening mammogram for malignant neoplasm of breast: Secondary | ICD-10-CM

## 2017-07-18 ENCOUNTER — Encounter: Payer: Self-pay | Admitting: Medical

## 2017-07-18 ENCOUNTER — Ambulatory Visit (INDEPENDENT_AMBULATORY_CARE_PROVIDER_SITE_OTHER): Payer: Commercial Managed Care - PPO | Admitting: Medical

## 2017-07-18 VITALS — BP 137/80 | HR 82 | Temp 98.1°F | Wt 133.8 lb

## 2017-07-18 DIAGNOSIS — J0100 Acute maxillary sinusitis, unspecified: Secondary | ICD-10-CM | POA: Diagnosis not present

## 2017-07-18 DIAGNOSIS — H938X2 Other specified disorders of left ear: Secondary | ICD-10-CM | POA: Diagnosis not present

## 2017-07-18 DIAGNOSIS — J3489 Other specified disorders of nose and nasal sinuses: Secondary | ICD-10-CM

## 2017-07-18 MED ORDER — FLUTICASONE PROPIONATE 50 MCG/ACT NA SUSP: 2.0000 | Freq: Every day | NASAL | 1 refills | Status: DC

## 2017-07-18 MED ORDER — AZITHROMYCIN 250 MG PO TABS: ORAL_TABLET | ORAL | 0 refills | Status: DC

## 2017-07-18 MED ORDER — ALBUTEROL SULFATE HFA 108 (90 BASE) MCG/ACT IN AERS: 2.0000 | INHALATION_SPRAY | Freq: Four times a day (QID) | RESPIRATORY_TRACT | 0 refills | Status: DC | PRN

## 2017-07-18 MED ORDER — BENZONATATE 100 MG PO CAPS: 100.0000 mg | ORAL_CAPSULE | Freq: Three times a day (TID) | ORAL | 0 refills | Status: DC | PRN

## 2017-07-18 NOTE — Patient Instructions (Addendum)
For nasal congestion, sinus pressure/infection and left ear pain, I will rx flonase nasal spray, for cough benzonatate, and rx azithromycin antibiotic.  Stay hydrated.Eat healthy and rest.  For transient dizziness if worsens let us know. If persists great than 5 minutes can use meclizine. Any motor or sensory deficits/symptoms with dizziness then ED evaluation.  Follow up 7-10 days or as needed  Note regarding patient reports that she feels like she cannot take full deep breath at times.  Her oxygen is 100% and her lungs were clear on exam.  However will go ahead and send inhaler and in the event she has some reactive airway disease.

## 2017-07-18 NOTE — Progress Notes (Signed)
Subjective:    Patient ID: Kelly Stanley, female    DOB: 1975-09-07, 41 y.o.   MRN: [id number]  HPI  Pt in states some recent chest congestion and mild nasal congestion. Pt states in the morning she is coughing up little mucous recently mostly just in the morning. Pt daughter sick recently and coughing up some mucous.(daughter 2 yo). Son also had recent illness.  Pt denies any wheezing.  Pt feels like can't take full deep breath. No hx of any inhaler use. Pt did exercise ran some yesterday in doors. She did not have any cough episodes. She ran a mile yesterday.  Some recent left upper tooth area pain with sinus pressure.  LMP- on presently.   Review of Systems  Constitutional: Negative for chills, fatigue and fever.  HENT: Positive for congestion, sinus pressure and sinus pain. Negative for sneezing and sore throat.        Left upper teeth pain last week.  Cardiovascular: Negative for chest pain and palpitations.  Genitourinary: Negative for dysuria, flank pain and frequency.  Musculoskeletal: Negative for back pain and myalgias.       No leg pain.  Skin: Negative for rash.  Neurological: Negative for dizziness, facial asymmetry and headaches.       Transient intermittent dizziness. Come in waves. Last for 2-3 minutes at most.  Hematological: Negative for adenopathy. Does not bruise/bleed easily.  Psychiatric/Behavioral: Negative for behavioral problems, confusion and dysphoric mood. The patient is not nervous/anxious.     Past Medical History:  Diagnosis Date  . Anxiety   . Syncope      Social History   Socioeconomic History  . Marital status: Married    Spouse name: Not on file  . Number of children: Not on file  . Years of education: Not on file  . Highest education level: Not on file  Social Needs  . Financial resource strain: Not on file  . Food insecurity - worry: Not on file  . Food insecurity - inability: Not on file  . Transportation needs - medical: Not  on file  . Transportation needs - non-medical: Not on file  Occupational History  . Not on file  Tobacco Use  . Smoking status: Never Smoker  . Smokeless tobacco: Never Used  Substance and Sexual Activity  . Alcohol use: Yes    Alcohol/week: 0.0 oz    Comment: Occas  . Drug use: No  . Sexual activity: Yes    Birth control/protection: Pill    Comment: 1st intercourse 41 yo-Fewer than 5 partners  Other Topics Concern  . Not on file  Social History Narrative  . Not on file    Past Surgical History:  Procedure Laterality Date  . APPENDECTOMY    . KNEE ARTHROSCOPY W/ LATERAL RELEASE Left 1997    Family History  Problem Relation Age of Onset  . Cancer Mother        ovarian  . Stroke Maternal Grandfather   . Diabetes Maternal Grandfather     Allergies  Allergen Reactions  . Codeine Nausea And Vomiting    Current Outpatient Medications on File Prior to Visit  Medication Sig Dispense Refill  . ALPRAZolam (XANAX) 0.25 MG tablet Take 1 tablet (0.25 mg total) by mouth 2 (two) times daily as needed for anxiety. (Patient not taking: Reported on 05/14/2017) 20 tablet 0  . desogestrel-ethinyl estradiol (AZURETTE) 0.15-0.02/0.01 MG (21/5) tablet Take 1 tablet by mouth daily. 28 tablet 12  . sertraline (ZOLOFT)  100 MG tablet TAKE 1/2 TABLET(50 MG) BY MOUTH DAILY 30 tablet 12   No current facility-administered medications on file prior to visit.     LMP 06/18/2017       Objective:   Physical Exam  General  Mental Status - Alert. General Appearance - Well groomed. Not in acute distress.  Skin Rashes- No Rashes.  HEENT Head- Normal. Ear Auditory Canal - Left- Normal. Right - Normal.Tympanic Membrane- Left- Normal. Right- Normal. Eye Sclera/Conjunctiva- Left- Normal. Right- Normal. Nose & Sinuses Nasal Mucosa- Left-  Boggy and Congested. Right-  Boggy and  Congested.Bilateral maxillary tenderness to palpation but  frontal sinus pressure. Mouth & Throat Lips: Upper Lip-  Normal: no dryness, cracking, pallor, cyanosis, or vesicular eruption. Lower Lip-Normal: no dryness, cracking, pallor, cyanosis or vesicular eruption. Buccal Mucosa- Bilateral- No Aphthous ulcers. Oropharynx- No Discharge or Erythema. Tonsils: Characteristics- Bilateral- No Erythema or Congestion. Size/Enlargement- Bilateral- No enlargement. Discharge- bilateral-None.  Neck Neck- Supple. No Masses.   Chest and Lung Exam Auscultation: Breath Sounds:-Clear even and unlabored.  Cardiovascular Auscultation:Rythm- Regular, rate and rhythm. Murmurs & Other Heart Sounds:Ausculatation of the heart reveal- No Murmurs.  Lymphatic Head & Neck General Head & Neck Lymphatics: Bilateral: Description- No Localized lymphadenopathy.       Assessment & Plan:  For nasal congestion, sinus pressure/infection and left ear pain, I will rx flonase nasal spray, for cough benzonatate, and rx azithromycin antibiotic.  Stay hydrated.Eat healthy and rest.  For transient dizziness if worsens let us know. If persists great than 5 minutes can use meclizine. Any motor or sensory deficits/symptoms with dizziness then ED evaluation.  Follow up 7-10 days or as needed  Note regarding patient reports that she feels like she cannot take full deep breath at times.  Her oxygen is 100% and her lungs were clear on exam.  However will go ahead and send inhaler and in the event she has some reactive airway disease.  Kelly Stanley, Kelly Stanley  Zakira Ressel, Percell Miller, PA-C

## 2017-07-23 ENCOUNTER — Ambulatory Visit (INDEPENDENT_AMBULATORY_CARE_PROVIDER_SITE_OTHER): Payer: Commercial Managed Care - PPO | Admitting: Family

## 2017-07-23 ENCOUNTER — Telehealth: Payer: Self-pay | Admitting: Family

## 2017-07-23 ENCOUNTER — Ambulatory Visit (HOSPITAL_BASED_OUTPATIENT_CLINIC_OR_DEPARTMENT_OTHER)
Admission: RE | Admit: 2017-07-23 | Discharge: 2017-07-23 | Disposition: A | Discharge status: Home / Self Care | Payer: Commercial Managed Care - PPO | Source: Ambulatory Visit | Attending: Family | Admitting: Family

## 2017-07-23 ENCOUNTER — Encounter: Payer: Self-pay | Admitting: Family

## 2017-07-23 VITALS — BP 122/73 | HR 73 | Temp 98.4°F | Resp 16 | Ht 65.0 in | Wt 130.0 lb

## 2017-07-23 DIAGNOSIS — R079 Chest pain, unspecified: Secondary | ICD-10-CM | POA: Diagnosis not present

## 2017-07-23 DIAGNOSIS — R0602 Shortness of breath: Secondary | ICD-10-CM | POA: Insufficient documentation

## 2017-07-23 LAB — CBC WITH DIFFERENTIAL/PLATELET
BASOS ABS: 0.1 10*3/uL (ref 0.0–0.1)
Basophils Relative: 1 % (ref 0.0–3.0)
Eosinophils Absolute: 0 10*3/uL (ref 0.0–0.7)
Eosinophils Relative: 0.5 % (ref 0.0–5.0)
HEMATOCRIT: 41.9 % (ref 36.0–46.0)
Hemoglobin: 14.1 g/dL (ref 12.0–15.0)
LYMPHS PCT: 28 % (ref 12.0–46.0)
Lymphs Abs: 2.3 10*3/uL (ref 0.7–4.0)
MCHC: 33.7 g/dL (ref 30.0–36.0)
MCV: 94 fl (ref 78.0–100.0)
MONOS PCT: 3.7 % (ref 3.0–12.0)
Monocytes Absolute: 0.3 10*3/uL (ref 0.1–1.0)
NEUTROS PCT: 66.8 % (ref 43.0–77.0)
Neutro Abs: 5.6 10*3/uL (ref 1.4–7.7)
Platelets: 232 10*3/uL (ref 150.0–400.0)
RBC: 4.46 Mil/uL (ref 3.87–5.11)
RDW: 12 % (ref 11.5–15.5)
WBC: 8.3 10*3/uL (ref 4.0–10.5)

## 2017-07-23 LAB — TSH: TSH: 1.87 u[IU]/mL (ref 0.35–4.50)

## 2017-07-23 LAB — D-DIMER, QUANTITATIVE: D-Dimer, Quant: 0.19 mcg/mL FEU (ref <0.50)

## 2017-07-23 MED ORDER — ALBUTEROL SULFATE (2.5 MG/3ML) 0.083% IN NEBU
2.5000 mg | INHALATION_SOLUTION | Freq: Once | RESPIRATORY_TRACT | Status: AC
  Administered 2017-07-23: 2.5 mg via RESPIRATORY_TRACT

## 2017-07-23 MED ORDER — MELOXICAM 7.5 MG PO TABS: 7.5000 mg | ORAL_TABLET | Freq: Every day | ORAL | 0 refills | Status: DC

## 2017-07-23 NOTE — Patient Instructions (Signed)
Please complete lab work prior to leaving. Complete chest x-ray on the first floor. He may continue to use Xanax as needed for anxiety. Please go to the emergency department if you develop severe worsening shortness of breath or chest pain. You may begin meloxicam (anti-inflammatory) once daily for musculoskeletal chest pain. We will contact you with your results as they become available.

## 2017-07-23 NOTE — Telephone Encounter (Signed)
Salda,representative of [employer] called stating the pt had a D-dimer quantitative of 0.19. Results will also be faxed to the office.

## 2017-07-23 NOTE — Progress Notes (Signed)
Subjective:    Patient ID: Kelly Stanley, female    DOB: [birth year], 41 y.o.   MRN: [id number]  HPI  Patient is a 40 year old female who presents today with chief complaint of breathing difficulty. Breathing feels tight.  Denies wheezing.  Mild chest congestion. Children are sick with viral illnesses.  She was given azithromycin, flonase (did not use) and tessalon. No significant improvement with the antibiotic.   Feels more easily winded.  Has not used an inhaler.    Review of Systems See HPI  Past Medical History:  Diagnosis Date  . Anxiety   . Syncope      Social History   Socioeconomic History  . Marital status: Married    Spouse name: Not on file  . Number of children: Not on file  . Years of education: Not on file  . Highest education level: Not on file  Social Needs  . Financial resource strain: Not on file  . Food insecurity - worry: Not on file  . Food insecurity - inability: Not on file  . Transportation needs - medical: Not on file  . Transportation needs - non-medical: Not on file  Occupational History  . Not on file  Tobacco Use  . Smoking status: Never Smoker  . Smokeless tobacco: Never Used  Substance and Sexual Activity  . Alcohol use: Yes    Alcohol/week: 0.0 oz    Comment: Occas  . Drug use: No  . Sexual activity: Yes    Birth control/protection: Pill    Comment: 1st intercourse 41 yo-Fewer than 5 partners  Other Topics Concern  . Not on file  Social History Narrative  . Not on file    Past Surgical History:  Procedure Laterality Date  . APPENDECTOMY    . KNEE ARTHROSCOPY W/ LATERAL RELEASE Left 1997    Family History  Problem Relation Age of Onset  . Cancer Mother        ovarian  . Stroke Maternal Grandfather   . Diabetes Maternal Grandfather     Allergies  Allergen Reactions  . Codeine Nausea And Vomiting    Current Outpatient Medications on File Prior to Visit  Medication Sig Dispense Refill  . albuterol (PROVENTIL  HFA;VENTOLIN HFA) 108 (90 Base) MCG/ACT inhaler Inhale 2 puffs into the lungs every 6 (six) hours as needed for wheezing or shortness of breath. 1 Inhaler 0  . ALPRAZolam (XANAX) 0.25 MG tablet Take 1 tablet (0.25 mg total) by mouth 2 (two) times daily as needed for anxiety. 20 tablet 0  . benzonatate (TESSALON) 100 MG capsule Take 1 capsule (100 mg total) by mouth 3 (three) times daily as needed for cough. 21 capsule 0  . desogestrel-ethinyl estradiol (AZURETTE) 0.15-0.02/0.01 MG (21/5) tablet Take 1 tablet by mouth daily. 28 tablet 12  . fluticasone (FLONASE) 50 MCG/ACT nasal spray Place 2 sprays into both nostrils daily. 16 g 1  . sertraline (ZOLOFT) 100 MG tablet TAKE 1/2 TABLET(50 MG) BY MOUTH DAILY 30 tablet 12   No current facility-administered medications on file prior to visit.     BP 122/73 (BP Location: Left Arm, Patient Position: Sitting, Cuff Size: Small)   Pulse 73   Temp 98.4 F (36.9 C) (Oral)   Resp 16   Ht 5\' 5"  (1.651 m)   Wt 130 lb (59 kg)   SpO2 100%   BMI 21.63 kg/m       Objective:   Physical Exam  Constitutional: She is oriented to person,  place, and time. She appears well-developed and well-nourished.  HENT:  Head: Normocephalic and atraumatic.  Cardiovascular: Normal rate, regular rhythm and normal heart sounds.  No murmur heard. Pulmonary/Chest: Effort normal and breath sounds normal. No respiratory distress.  Soft expiratory wheeze noted  Musculoskeletal: She exhibits no edema.  Reproducible tenderness to palpation left sided chest wall and left anterior neck  Neurological: She is alert and oriented to person, place, and time.  Psychiatric: She has a normal mood and affect. Her behavior is normal. Judgment and thought content normal.          Assessment & Plan:  Atypical chest pain/SOB-EKG is performed today and personally reviewed.  EKG notes normal sinus rhythm.  She was given an albuterol nebulizer today without significant improvement in her  symptoms.  I believe that her chest pain is musculoskeletal in nature.  Will give trial of meloxicam.  She also may have some superimposed anxiety.  I have advised her okay to use Xanax as needed.  I am checking a baseline chest x-ray as well as a d-dimer.  We discussed that if the d-dimer is positive she will need to proceed with a CT angios the chest.  She understands and agrees with plan.

## 2017-07-24 LAB — COMPREHENSIVE METABOLIC PANEL
ALK PHOS: 42 U/L (ref 39–117)
ALT: 15 U/L (ref 0–35)
AST: 21 U/L (ref 0–37)
Albumin: 4.3 g/dL (ref 3.5–5.2)
BILIRUBIN TOTAL: 0.5 mg/dL (ref 0.2–1.2)
BUN: 15 mg/dL (ref 6–23)
CHLORIDE: 103 meq/L (ref 96–112)
CO2: 27 meq/L (ref 19–32)
CREATININE: 0.78 mg/dL (ref 0.40–1.20)
Calcium: 9.6 mg/dL (ref 8.4–10.5)
GFR: 86.87 mL/min (ref >60.00)
GLUCOSE: 98 mg/dL (ref 70–99)
POTASSIUM: 4.1 meq/L (ref 3.5–5.1)
SODIUM: 139 meq/L (ref 135–145)
Total Protein: 7.2 g/dL (ref 6.0–8.3)

## 2017-07-24 NOTE — Telephone Encounter (Signed)
FYI

## 2017-07-24 NOTE — Telephone Encounter (Signed)
Noted  

## 2017-08-04 ENCOUNTER — Other Ambulatory Visit: Payer: Self-pay | Admitting: Gynecology

## 2017-08-06 ENCOUNTER — Telehealth: Payer: Self-pay | Admitting: *Deleted

## 2017-08-06 MED ORDER — SERTRALINE HCL 100 MG PO TABS: 100.0000 mg | ORAL_TABLET | Freq: Every day | ORAL | 9 refills | Status: DC

## 2017-08-06 NOTE — Telephone Encounter (Signed)
Okay for refill through next annual exam for the 100 mg Zoloft daily

## 2017-08-06 NOTE — Telephone Encounter (Signed)
Rx sent 

## 2017-08-06 NOTE — Telephone Encounter (Signed)
Patient has Rx for Zoloft 100 mg, but the directions states take 50 mg daily. Pt has tried the 100 mg and has done well with this. Patient now needs a Rx stating Zoloft 100 mg tablets 1 po daily. Please advise

## 2017-08-24 ENCOUNTER — Other Ambulatory Visit: Payer: Self-pay

## 2017-08-24 MED ORDER — DESOGESTREL-ETHINYL ESTRADIOL 0.15-0.02/0.01 MG (21/5) PO TABS: 1.0000 | ORAL_TABLET | Freq: Every day | ORAL | 3 refills | Status: DC

## 2017-08-24 MED ORDER — SERTRALINE HCL 100 MG PO TABS: 100.0000 mg | ORAL_TABLET | Freq: Every day | ORAL | 3 refills | Status: DC

## 2018-02-06 DIAGNOSIS — H6500 Acute serous otitis media, unspecified ear: Secondary | ICD-10-CM | POA: Diagnosis not present

## 2018-05-19 ENCOUNTER — Other Ambulatory Visit: Payer: Self-pay | Admitting: Gynecology

## 2018-06-04 ENCOUNTER — Encounter: Payer: Self-pay | Admitting: Gynecology

## 2018-06-04 ENCOUNTER — Ambulatory Visit (INDEPENDENT_AMBULATORY_CARE_PROVIDER_SITE_OTHER): Payer: Commercial Managed Care - PPO | Admitting: Gynecology

## 2018-06-04 VITALS — BP 110/72 | Ht 64.0 in | Wt 134.0 lb

## 2018-06-04 DIAGNOSIS — Z01419 Encounter for gynecological examination (general) (routine) without abnormal findings: Secondary | ICD-10-CM | POA: Diagnosis not present

## 2018-06-04 DIAGNOSIS — Z1151 Encounter for screening for human papillomavirus (HPV): Secondary | ICD-10-CM

## 2018-06-04 DIAGNOSIS — F410 Panic disorder [episodic paroxysmal anxiety] without agoraphobia: Secondary | ICD-10-CM

## 2018-06-04 MED ORDER — ALPRAZOLAM 0.25 MG PO TABS: 0.2500 mg | ORAL_TABLET | Freq: Two times a day (BID) | ORAL | 0 refills | Status: DC | PRN

## 2018-06-04 MED ORDER — DESOGESTREL-ETHINYL ESTRADIOL 0.15-0.02/0.01 MG (21/5) PO TABS: 1.0000 | ORAL_TABLET | Freq: Every day | ORAL | 12 refills | Status: DC

## 2018-06-04 MED ORDER — SERTRALINE HCL 100 MG PO TABS: 100.0000 mg | ORAL_TABLET | Freq: Every day | ORAL | 3 refills | Status: DC

## 2018-06-04 NOTE — Patient Instructions (Signed)
Follow-up in 1 year for annual exam, sooner if any issues. 

## 2018-06-04 NOTE — Progress Notes (Signed)
    Kelly Stanley 07-07-1976 [id number]        42 y.o.  [id number] for annual gynecologic exam.  Doing well without gynecologic complaints.  Past medical history,surgical history, problem list, medications, allergies, family history and social history were all reviewed and documented as reviewed in the EPIC chart.  ROS:  Performed with pertinent positives and negatives included in the history, assessment and plan.   Additional significant findings : None   Exam: [name] assistant Vitals:   06/04/18 1553  BP: 110/72  Weight: 134 lb (60.8 kg)  Height: 5\' 4"  (1.626 m)   Body mass index is 23 kg/m.  General appearance:  Normal affect, orientation and appearance. Skin: Grossly normal HEENT: Without gross lesions.  No cervical or supraclavicular adenopathy. Thyroid normal.  Lungs:  Clear without wheezing, rales or rhonchi Cardiac: RR, without RMG Abdominal:  Soft, nontender, without masses, guarding, rebound, organomegaly or hernia Breasts:  Examined lying and sitting without masses, retractions, discharge or axillary adenopathy. Pelvic:  Ext, BUS, Vagina: Normal  Cervix: Normal.  Pap smear/HPV  Uterus: Anteverted, normal size, shape and contour, midline and mobile nontender   Adnexa: Without masses or tenderness    Anus and perineum: Normal   Rectovaginal: Normal sphincter tone without palpated masses or tenderness.    Assessment/Plan:  42 y.o. [id number] female for annual gynecologic exam with regular menses, oral contraceptives.   1. Continues on drospirenone pill.  Doing well.  Discussed every other to every third month withdrawal option.  Also reviewed decreased ovarian cancer risk benefits noting maternal history of ovarian cancer.  Never smoked very active athletically and not being followed for any medical issues.  Refill x1 year provided. 2. Anxiety attacks.  On Zoloft 50 mg daily doing well.  Occasionally uses Xanax 0.25 mg although rarely.  Refill Zoloft 100 mg 1/2 tablet  daily as she prefers x1 year.  Xanax 0.25 mg #20 to have available. 3. Pap smear/HPV 12/2013.  Pap smear/HPV done today.  History of LGSIL 2005 through 2007 with negative colposcopy.  Normal Pap smears since. 4. Mammography coming due and patient is going to call and schedule.  Breast exam normal today. 5. Maternal history of ovarian cancer.  Genetically tested negative.  No other family members with breast colon ovary or uterine cancer.  We have previously discussed screening strategies and the patient is not interested at this time. 6. Health maintenance.  No routine lab work done as patient reports this done elsewhere.  Follow-up 1 year, sooner as needed.   [name] MD, 4:37 PM 06/04/2018

## 2018-06-04 NOTE — Addendum Note (Signed)
Addended by: Nelva Nay on: 06/04/2018 04:48 PM   Modules accepted: Orders

## 2018-06-06 ENCOUNTER — Encounter: Payer: Self-pay | Admitting: Gynecology

## 2018-06-06 LAB — PAP IG AND HPV HIGH-RISK: HPV DNA High Risk: NOT DETECTED

## 2018-08-16 ENCOUNTER — Other Ambulatory Visit: Payer: Self-pay | Admitting: Gynecology

## 2018-08-16 ENCOUNTER — Ambulatory Visit: Payer: Self-pay

## 2018-08-16 ENCOUNTER — Ambulatory Visit: Payer: Self-pay | Admitting: *Deleted

## 2018-08-16 DIAGNOSIS — Z1231 Encounter for screening mammogram for malignant neoplasm of breast: Secondary | ICD-10-CM

## 2018-08-16 MED ORDER — OSELTAMIVIR PHOSPHATE 75 MG PO CAPS: 75.0000 mg | ORAL_CAPSULE | Freq: Every day | ORAL | 0 refills | Status: DC

## 2018-08-16 NOTE — Telephone Encounter (Signed)
Duplicate; see previous triage encounter dated 08/16/18 at 1214

## 2018-08-16 NOTE — Addendum Note (Signed)
Addended by: Lamar Blinks C on: 08/16/2018 01:28 PM   Modules accepted: Orders

## 2018-08-16 NOTE — Telephone Encounter (Signed)
Called her- I sent in rx for tamiflu Per my understanding it is ok to start on this and have shot today; shot will not offer immediate protection. Check with pharmd

## 2018-08-16 NOTE — Telephone Encounter (Signed)
Pt states her children tested positive for flu 08/16/18; requests tamilfu; nurse triage initiated and recommendations made per protocol; she uses [place], [place]; the pt says that she has not received a flu shot this season and wonders if she should get one; the pt's contact number is [phone]; pt is seen by Dr Lamar Blinks, [place]; will route to office for final disposition.  Reason for Disposition . [1] Influenza EXPOSURE within last 48 hours (2 days) AND [2] NOT HIGH RISK AND [3] strongly requests antiviral medication  Answer Assessment - Initial Assessment Questions 1. TYPE of EXPOSURE: "How were you exposed?" (e.g., close contact, not a close contact)   Close contact; Children positive for flu   2. DATE of EXPOSURE: "When did the exposure occur?" (e.g., hour, days, weeks)     08/16/28  3. PREGNANCY: "Is there any chance you are pregnant?" "When was your last menstrual period?"     No 08/12/18 4. HIGH RISK for COMPLICATIONS: "Do you have any heart or lung problems? Do you have a weakened immune system?" (e.g., CHF, COPD, asthma, HIV positive, chemotherapy, renal failure, diabetes mellitus, sickle cell anemia)     no 5. SYMPTOMS: "Do you have any symptoms?" (e.g., cough, fever, sore throat, difficulty breathing).    Headache, achy  Protocols used: INFLUENZA EXPOSURE-A-AH

## 2018-09-18 ENCOUNTER — Ambulatory Visit
Admission: RE | Admit: 2018-09-18 | Discharge: 2018-09-18 | Disposition: A | Discharge status: Home / Self Care | Payer: Commercial Managed Care - PPO | Source: Ambulatory Visit | Attending: Gynecology | Admitting: Gynecology

## 2018-09-18 DIAGNOSIS — Z1231 Encounter for screening mammogram for malignant neoplasm of breast: Secondary | ICD-10-CM

## 2018-09-19 ENCOUNTER — Other Ambulatory Visit: Payer: Self-pay | Admitting: Gynecology

## 2018-09-19 DIAGNOSIS — R928 Other abnormal and inconclusive findings on diagnostic imaging of breast: Secondary | ICD-10-CM

## 2018-09-25 ENCOUNTER — Ambulatory Visit
Admission: RE | Admit: 2018-09-25 | Discharge: 2018-09-25 | Disposition: A | Discharge status: Home / Self Care | Payer: Commercial Managed Care - PPO | Source: Ambulatory Visit | Attending: Gynecology | Admitting: Gynecology

## 2018-09-25 DIAGNOSIS — R928 Other abnormal and inconclusive findings on diagnostic imaging of breast: Secondary | ICD-10-CM

## 2018-09-25 DIAGNOSIS — N6489 Other specified disorders of breast: Secondary | ICD-10-CM | POA: Diagnosis not present

## 2018-09-25 DIAGNOSIS — R922 Inconclusive mammogram: Secondary | ICD-10-CM | POA: Diagnosis not present

## 2018-09-25 NOTE — Progress Notes (Deleted)
[place] [facility] [address], [place], [place] [address] [phone]  Date:  09/26/2018   Name:  Kelly Stanley   DOB:  08/06/1976   MRN:  [id number]  PCP:  Darreld Mclean, MD    Chief Complaint: No chief complaint on file.   History of Present Illness:  Kelly Stanley is a 43 y.o. 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 Allergic contact dermatitis 01/26/2014  . Anxiety and depression 11/17/2013  . Chronic night sweats 11/17/2013  . Family history of ovarian cancer 11/17/2013  . Allergic rhinitis 11/17/2013  . Orthostatic hypotension 11/17/2013    Past Medical History:  Diagnosis Date  . Anxiety   . Syncope     Past Surgical History:  Procedure Laterality Date  . APPENDECTOMY    . KNEE ARTHROSCOPY W/ LATERAL RELEASE Left 1997    Social History   Tobacco Use  . Smoking status: Never Smoker  . Smokeless tobacco: Never Used  Substance Use Topics  . Alcohol use: Yes    Alcohol/week: 0.0 standard drinks    Comment: Occas  . Drug use: No    Family History  Problem Relation Age of Onset  . Cancer Mother        ovarian  . Stroke Maternal Grandfather   . Diabetes Maternal Grandfather     Allergies  Allergen Reactions  . Codeine Nausea And Vomiting    Medication list has been reviewed and updated.  Current Outpatient Medications on File Prior to Visit  Medication Sig Dispense Refill  . ALPRAZolam (XANAX) 0.25 MG tablet Take 1 tablet (0.25 mg total) by  mouth 2 (two) times daily as needed for anxiety. 20 tablet 0  . desogestrel-ethinyl estradiol (VIORELE) 0.15-0.02/0.01 MG (21/5) tablet Take 1 tablet by mouth daily. 28 tablet 12  . oseltamivir (TAMIFLU) 75 MG capsule Take 1 capsule (75 mg total) by mouth daily. 10 capsule 0  . sertraline (ZOLOFT) 100 MG tablet Take 1 tablet (100 mg total) by mouth daily. 90 tablet 3   No current facility-administered medications on file prior to visit.     Review of Systems:  As per HPI- otherwise negative.   Physical Examination: There were no vitals filed for this visit. There were no vitals filed for this visit. There is no height or weight on file to calculate BMI. Ideal Body Weight:    GEN: WDWN, NAD, Non-toxic, A & O x 3 HEENT: Atraumatic, Normocephalic. Neck supple. No masses, No LAD. Ears and Nose: No external deformity. CV: RRR, No M/G/R. No JVD. No thrill. No extra heart sounds. PULM: CTA B, no wheezes, crackles, rhonchi. No retractions. No resp. distress. No accessory muscle use. ABD: S, NT, ND, +BS. No rebound. No HSM. EXTR: No c/c/e NEURO Normal gait.  PSYCH: Normally interactive. Conversant. Not depressed or anxious appearing.  Calm demeanor.    Assessment and Plan: ***  Signed Lamar Blinks, MD

## 2018-09-26 ENCOUNTER — Encounter: Payer: Commercial Managed Care - PPO | Admitting: Family Medicine

## 2018-09-28 NOTE — Progress Notes (Deleted)
[place] at [facility] [address], [place], [place] [address] [phone] [id number]  Date:  09/30/2018   Name:  Kelly Stanley   DOB:  August 08, 1976   MRN:  [id number]  PCP:  Darreld Mclean, MD    Chief Complaint: No chief complaint on file.   History of Present Illness:  Kelly Stanley is a 42 y.o. very pleasant female patient who presents with the following:  Here today for physical-she is generally in very good health and does not come in very often I last saw her in August 2018 She works at [employer] as a Pharmacist, hospital She has 2 children, enjoys running for exercise History of allergic rhinitis and orthostatic hypotension, anxiety managed with Zoloft and rare Xanax She sees Dr. Phineas Real, Pap was done in the fall Verlyn's mother had ovarian cancer, the patient had genetic testing and was negative  Pap: Up-to-date Immunizations: Question flu Labs: Due for complete panel Mammogram: Just done  Patient Active Problem List   Diagnosis Date Noted  . 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 Allergic contact dermatitis 01/26/2014  . Anxiety and depression 11/17/2013  . Chronic night sweats 11/17/2013  . Family history of ovarian cancer 11/17/2013  . Allergic rhinitis 11/17/2013  . Orthostatic hypotension 11/17/2013    Past Medical History:  Diagnosis Date  . Anxiety   . Syncope     Past Surgical History:  Procedure Laterality Date  . APPENDECTOMY    . KNEE ARTHROSCOPY W/ LATERAL RELEASE Left 1997    Social History   Tobacco Use  . Smoking status: Never Smoker  . Smokeless tobacco: Never Used  Substance Use Topics  . Alcohol use: Yes    Alcohol/week: 0.0 standard drinks    Comment: Occas  . Drug use: No    Family History  Problem Relation Age of Onset  . Cancer Mother        ovarian  . Stroke Maternal Grandfather   . Diabetes Maternal Grandfather     Allergies  Allergen Reactions  . Codeine Nausea And  Vomiting    Medication list has been reviewed and updated.  Current Outpatient Medications on File Prior to Visit  Medication Sig Dispense Refill  . ALPRAZolam (XANAX) 0.25 MG tablet Take 1 tablet (0.25 mg total) by mouth 2 (two) times daily as needed for anxiety. 20 tablet 0  . desogestrel-ethinyl estradiol (VIORELE) 0.15-0.02/0.01 MG (21/5) tablet Take 1 tablet by mouth daily. 28 tablet 12  . oseltamivir (TAMIFLU) 75 MG capsule Take 1 capsule (75 mg total) by mouth daily. 10 capsule 0  . sertraline (ZOLOFT) 100 MG tablet Take 1 tablet (100 mg total) by mouth daily. 90 tablet 3   No current facility-administered medications on file prior to visit.     Review of Systems:  As per HPI- otherwise negative.   Physical Examination: There were no vitals filed for this visit. There were no vitals filed for this visit. There is no height or weight on file to calculate BMI. Ideal Body Weight:    GEN: WDWN, NAD, Non-toxic, A & O x 3 HEENT: Atraumatic, Normocephalic. Neck supple. No masses, No LAD. Ears and Nose: No external deformity. CV: RRR, No M/G/R. No JVD. No thrill. No extra heart sounds. PULM: CTA B, no wheezes, crackles, rhonchi. No retractions. No resp. distress. No accessory muscle use. ABD: S, NT, ND, +BS. No rebound. No HSM. EXTR: No c/c/e NEURO Normal gait.  PSYCH:  Normally interactive. Conversant. Not depressed or anxious appearing.  Calm demeanor. '  Assessment and Plan: ***  Signed Lamar Blinks, MD

## 2018-09-30 ENCOUNTER — Encounter: Payer: Commercial Managed Care - PPO | Admitting: Family Medicine

## 2018-10-01 DIAGNOSIS — L821 Other seborrheic keratosis: Secondary | ICD-10-CM | POA: Diagnosis not present

## 2018-10-01 DIAGNOSIS — L738 Other specified follicular disorders: Secondary | ICD-10-CM | POA: Diagnosis not present

## 2018-10-01 DIAGNOSIS — L570 Actinic keratosis: Secondary | ICD-10-CM | POA: Diagnosis not present

## 2018-10-01 DIAGNOSIS — L814 Other melanin hyperpigmentation: Secondary | ICD-10-CM | POA: Diagnosis not present

## 2018-10-08 NOTE — Progress Notes (Addendum)
[place] at [facility] [address], [place], [place] [address] [phone] [id number]  Date:  10/09/2018   Name:  Kelly Stanley   DOB:  [birth year]   MRN:  [id number]  PCP:  Darreld Mclean, MD    Chief Complaint: Annual Exam (flu shot)   History of Present Illness:  Kelly Stanley is a 43 y.o. very pleasant female patient who presents with the following:  Generally healthy woman here today for physical exam History of allergic rhinitis, orthostatic hypotension, depression and anxiety She has never been a smoker I last saw her for a physical in August 2018 Her GYN is Dr. Phineas Real  She is married, has 2 school-age children.  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  She has history of orthostatic hypotension symptoms, and bradycardia  Pap: 10/19 Mammogram: 09/2018- repeat in one year Labs: Due today, she did eat just a few crackers today Immunizations: Question flu- today   She decided to become a vegan as of a few weeks ago, just for her health  She added a multivitamin with B12 and iron, and also an omega 3, and a probiotic   She is on Zoloft 50 mg, and feels like this works well for her   Patient Active Problem List   Diagnosis Date Noted  . 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 Allergic contact dermatitis 01/26/2014  . Anxiety and depression 11/17/2013  . Chronic night sweats 11/17/2013  . Family history of ovarian cancer 11/17/2013  . Allergic rhinitis 11/17/2013  . Orthostatic hypotension 11/17/2013    Past Medical History:  Diagnosis Date  . Anxiety   . Syncope     Past Surgical History:  Procedure Laterality Date  . APPENDECTOMY    . KNEE ARTHROSCOPY W/ LATERAL RELEASE Left 1997    Social History   Tobacco Use  . Smoking status: Never Smoker  . Smokeless tobacco: Never Used  Substance Use Topics  . Alcohol use:  Yes    Alcohol/week: 0.0 standard drinks    Comment: Occas  . Drug use: No    Family History  Problem Relation Age of Onset  . Cancer Mother        ovarian  . Stroke Maternal Grandfather   . Diabetes Maternal Grandfather     Allergies  Allergen Reactions  . Codeine Nausea And Vomiting    Medication list has been reviewed and updated.  Current Outpatient Medications on File Prior to Visit  Medication Sig Dispense Refill  . ALPRAZolam (XANAX) 0.25 MG tablet Take 1 tablet (0.25 mg total) by mouth 2 (two) times daily as needed for anxiety. 20 tablet 0  . desogestrel-ethinyl estradiol (VIORELE) 0.15-0.02/0.01 MG (21/5) tablet Take 1 tablet by mouth daily. 28 tablet 12  . Multiple Vitamin (MULTIVITAMIN) tablet Take 1 tablet by mouth daily.    . Omega-3 1000 MG CAPS Take by mouth.    . sertraline (ZOLOFT) 100 MG tablet Take 1 tablet (100 mg total) by mouth daily. 90 tablet 3   No current facility-administered medications on file prior to visit.     Review of Systems:  As per HPI- otherwise negative. She just saw her Derm - all ok No CP or SOB   Physical Examination: Vitals:   10/09/18 1410  BP: 112/64  Pulse: 75  Resp: 16  Temp:  97.7 F (36.5 C)  SpO2: 98%   Vitals:   10/09/18 1410  Weight: 133 lb (60.3 kg)  Height: 5\' 4"  (1.626 m)   Body mass index is 22.83 kg/m. Ideal Body Weight: Weight in (lb) to have BMI = 25: 145.3  GEN: WDWN, NAD, Non-toxic, A & O x 3, normal weight, fit appearance  HEENT: Atraumatic, Normocephalic. Neck supple. No masses, No LAD. Ears and Nose: No external deformity. CV: RRR, No M/G/R. No JVD. No thrill. No extra heart sounds. PULM: CTA B, no wheezes, crackles, rhonchi. No retractions. No resp. distress. No accessory muscle use. ABD: S, NT, ND, +BS. No rebound. No HSM. EXTR: No c/c/e NEURO Normal gait.  PSYCH: Normally interactive. Conversant. Not depressed or anxious appearing.  Calm demeanor.    Assessment and Plan: Physical  exam  Screening for hyperlipidemia - Plan: Lipid panel  Screening for diabetes mellitus - Plan: Comprehensive metabolic panel, Hemoglobin A1c  Screening for deficiency anemia - Plan: CBC  Vitamin D deficiency - Plan: Vitamin D (25 hydroxy)  Here today for a CPE Labs pending as above She is under GYN care Recent diet change- she went vegan for her health She will contact me in 6 months for a B12 level Mood is good   Signed Lamar Blinks, MD  Received her labs as below, message to patient  Results for orders placed or performed in visit on 10/09/18  CBC  Result Value Ref Range   WBC 7.9 4.0 - 10.5 K/uL   RBC 4.58 3.87 - 5.11 Mil/uL   Platelets 206.0 150.0 - 400.0 K/uL   Hemoglobin 14.5 12.0 - 15.0 g/dL   HCT 42.4 36.0 - 46.0 %   MCV 92.6 78.0 - 100.0 fl   MCHC 34.3 30.0 - 36.0 g/dL   RDW 12.3 11.5 - 15.5 %  Comprehensive metabolic panel  Result Value Ref Range   Sodium 138 135 - 145 mEq/L   Potassium 3.8 3.5 - 5.1 mEq/L   Chloride 100 96 - 112 mEq/L   CO2 28 19 - 32 mEq/L   Glucose, Bld 75 70 - 99 mg/dL   BUN 13 6 - 23 mg/dL   Creatinine, Ser 0.82 0.40 - 1.20 mg/dL   Total Bilirubin 0.5 0.2 - 1.2 mg/dL   Alkaline Phosphatase 49 39 - 117 U/L   AST 23 0 - 37 U/L   ALT 19 0 - 35 U/L   Total Protein 6.9 6.0 - 8.3 g/dL   Albumin 4.3 3.5 - 5.2 g/dL   Calcium 9.4 8.4 - 10.5 mg/dL   GFR 76.69 >60.00 mL/min  Hemoglobin A1c  Result Value Ref Range   Hgb A1c MFr Bld 5.3 4.6 - 6.5 %  Lipid panel  Result Value Ref Range   Cholesterol 187 0 - 200 mg/dL   Triglycerides 77.0 0.0 - 149.0 mg/dL   HDL 76.70 >39.00 mg/dL   VLDL 15.4 0.0 - 40.0 mg/dL   LDL Cholesterol 95 0 - 99 mg/dL   Total CHOL/HDL Ratio 2    NonHDL 110.17   Vitamin D (25 hydroxy)  Result Value Ref Range   VITD 45.06 30.00 - 100.00 ng/mL

## 2018-10-09 ENCOUNTER — Encounter: Payer: Self-pay | Admitting: Family Medicine

## 2018-10-09 ENCOUNTER — Ambulatory Visit (INDEPENDENT_AMBULATORY_CARE_PROVIDER_SITE_OTHER): Payer: Commercial Managed Care - PPO | Admitting: Family Medicine

## 2018-10-09 VITALS — BP 112/64 | HR 75 | Temp 97.7°F | Resp 16 | Ht 64.0 in | Wt 133.0 lb

## 2018-10-09 DIAGNOSIS — E559 Vitamin D deficiency, unspecified: Secondary | ICD-10-CM

## 2018-10-09 DIAGNOSIS — Z13220 Encounter for screening for lipoid disorders: Secondary | ICD-10-CM | POA: Diagnosis not present

## 2018-10-09 DIAGNOSIS — Z131 Encounter for screening for diabetes mellitus: Secondary | ICD-10-CM | POA: Diagnosis not present

## 2018-10-09 DIAGNOSIS — Z130 Encounter for screening for diseases of the blood and blood-forming organs and certain disorders involving the immune mechanism: Secondary | ICD-10-CM | POA: Diagnosis not present

## 2018-10-09 DIAGNOSIS — Z0000 Encounter for general adult medical examination without abnormal findings: Secondary | ICD-10-CM | POA: Diagnosis not present

## 2018-10-09 LAB — COMPREHENSIVE METABOLIC PANEL WITH GFR
ALT: 19 U/L (ref 0–35)
AST: 23 U/L (ref 0–37)
Albumin: 4.3 g/dL (ref 3.5–5.2)
Alkaline Phosphatase: 49 U/L (ref 39–117)
BUN: 13 mg/dL (ref 6–23)
CO2: 28 meq/L (ref 19–32)
Calcium: 9.4 mg/dL (ref 8.4–10.5)
Chloride: 100 meq/L (ref 96–112)
Creatinine, Ser: 0.82 mg/dL (ref 0.40–1.20)
GFR: 76.69 mL/min
Glucose, Bld: 75 mg/dL (ref 70–99)
Potassium: 3.8 meq/L (ref 3.5–5.1)
Sodium: 138 meq/L (ref 135–145)
Total Bilirubin: 0.5 mg/dL (ref 0.2–1.2)
Total Protein: 6.9 g/dL (ref 6.0–8.3)

## 2018-10-09 LAB — CBC
HCT: 42.4 % (ref 36.0–46.0)
Hemoglobin: 14.5 g/dL (ref 12.0–15.0)
MCHC: 34.3 g/dL (ref 30.0–36.0)
MCV: 92.6 fl (ref 78.0–100.0)
Platelets: 206 10*3/uL (ref 150.0–400.0)
RBC: 4.58 Mil/uL (ref 3.87–5.11)
RDW: 12.3 % (ref 11.5–15.5)
WBC: 7.9 10*3/uL (ref 4.0–10.5)

## 2018-10-09 LAB — LIPID PANEL
CHOL/HDL RATIO: 2
Cholesterol: 187 mg/dL (ref 0–200)
HDL: 76.7 mg/dL (ref >39.00)
LDL Cholesterol: 95 mg/dL (ref 0–99)
NonHDL: 110.17
Triglycerides: 77 mg/dL (ref 0.0–149.0)
VLDL: 15.4 mg/dL (ref 0.0–40.0)

## 2018-10-09 LAB — HEMOGLOBIN A1C: Hgb A1c MFr Bld: 5.3 % (ref 4.6–6.5)

## 2018-10-09 LAB — VITAMIN D 25 HYDROXY (VIT D DEFICIENCY, FRACTURES): VITD: 45.06 ng/mL (ref 30.00–100.00)

## 2018-10-09 NOTE — Patient Instructions (Addendum)
It was good to see you today!  Best of luck with your diet changes, but be sure that you are getting enough calories   I will be in touch with your labs asap You got your flu shot today If you would like to be a B12 level, etc in about 6 months please let me know   hHealth Maintenance, Female Adopting a healthy lifestyle and getting preventive care can go a long way to promote health and wellness. Talk with your health care provider about what schedule of regular examinations is right for you. This is a good chance for you to check in with your provider about disease prevention and staying healthy. In between checkups, there are plenty of things you can do on your own. Experts have done a lot of research about which lifestyle changes and preventive measures are most likely to keep you healthy. Ask your health care provider for more information. Weight and diet Eat a healthy diet  Be sure to include plenty of vegetables, fruits, low-fat dairy products, and lean protein.  Do not eat a lot of foods high in solid fats, added sugars, or salt.  Get regular exercise. This is one of the most important things you can do for your health. ? Most adults should exercise for at least 150 minutes each week. The exercise should increase your heart rate and make you sweat (moderate-intensity exercise). ? Most adults should also do strengthening exercises at least twice a week. This is in addition to the moderate-intensity exercise. Maintain a healthy weight  Body mass index (BMI) is a measurement that can be used to identify possible weight problems. It estimates body fat based on height and weight. Your health care provider can help determine your BMI and help you achieve or maintain a healthy weight.  For females 76 years of age and older: ? A BMI below 18.5 is considered underweight. ? A BMI of 18.5 to 24.9 is normal. ? A BMI of 25 to 29.9 is considered overweight. ? A BMI of 30 and above is considered  obese. Watch levels of cholesterol and blood lipids  You should start having your blood tested for lipids and cholesterol at 42 years of age, then have this test every 5 years.  You may need to have your cholesterol levels checked more often if: ? Your lipid or cholesterol levels are high. ? You are older than 42 years of age. ? You are at high risk for heart disease. Cancer screening Lung Cancer  Lung cancer screening is recommended for adults 15-62 years old who are at high risk for lung cancer because of a history of smoking.  A yearly low-dose CT scan of the lungs is recommended for people who: ? Currently smoke. ? Have quit within the past 15 years. ? Have at least a 30-pack-year history of smoking. A pack year is smoking an average of one pack of cigarettes a day for 1 year.  Yearly screening should continue until it has been 15 years since you quit.  Yearly screening should stop if you develop a health problem that would prevent you from having lung cancer treatment. Breast Cancer  Practice breast self-awareness. This means understanding how your breasts normally appear and feel.  It also means doing regular breast self-exams. Let your health care provider know about any changes, no matter how small.  If you are in your 20s or 30s, you should have a clinical breast exam (CBE) by a health care provider  every 1-3 years as part of a regular health exam.  If you are 27 or older, have a CBE every year. Also consider having a breast X-ray (mammogram) every year.  If you have a family history of breast cancer, talk to your health care provider about genetic screening.  If you are at high risk for breast cancer, talk to your health care provider about having an MRI and a mammogram every year.  Breast cancer gene (BRCA) assessment is recommended for women who have family members with BRCA-related cancers. BRCA-related cancers include: ? Breast. ? Ovarian. ? Tubal. ? Peritoneal  cancers.  Results of the assessment will determine the need for genetic counseling and BRCA1 and BRCA2 testing. Cervical Cancer Your health care provider may recommend that you be screened regularly for cancer of the pelvic organs (ovaries, uterus, and vagina). This screening involves a pelvic examination, including checking for microscopic changes to the surface of your cervix (Pap test). You may be encouraged to have this screening done every 3 years, beginning at age 55.  For women ages 79-65, health care providers may recommend pelvic exams and Pap testing every 3 years, or they may recommend the Pap and pelvic exam, combined with testing for human papilloma virus (HPV), every 5 years. Some types of HPV increase your risk of cervical cancer. Testing for HPV may also be done on women of any age with unclear Pap test results.  Other health care providers may not recommend any screening for nonpregnant women who are considered low risk for pelvic cancer and who do not have symptoms. Ask your health care provider if a screening pelvic exam is right for you.  If you have had past treatment for cervical cancer or a condition that could lead to cancer, you need Pap tests and screening for cancer for at least 20 years after your treatment. If Pap tests have been discontinued, your risk factors (such as having a new sexual partner) need to be reassessed to determine if screening should resume. Some women have medical problems that increase the chance of getting cervical cancer. In these cases, your health care provider may recommend more frequent screening and Pap tests. Colorectal Cancer  This type of cancer can be detected and often prevented.  Routine colorectal cancer screening usually begins at 42 years of age and continues through 42 years of age.  Your health care provider may recommend screening at an earlier age if you have risk factors for colon cancer.  Your health care provider may also  recommend using home test kits to check for hidden blood in the stool.  A small camera at the end of a tube can be used to examine your colon directly (sigmoidoscopy or colonoscopy). This is done to check for the earliest forms of colorectal cancer.  Routine screening usually begins at age 59.  Direct examination of the colon should be repeated every 5-10 years through 42 years of age. However, you may need to be screened more often if early forms of precancerous polyps or small growths are found. Skin Cancer  Check your skin from head to toe regularly.  Tell your health care provider about any new moles or changes in moles, especially if there is a change in a mole's shape or color.  Also tell your health care provider if you have a mole that is larger than the size of a pencil eraser.  Always use sunscreen. Apply sunscreen liberally and repeatedly throughout the day.  Protect yourself by  wearing long sleeves, pants, a wide-brimmed hat, and sunglasses whenever you are outside. Heart disease, diabetes, and high blood pressure  High blood pressure causes heart disease and increases the risk of stroke. High blood pressure is more likely to develop in: ? People who have blood pressure in the high end of the normal range (130-139/85-89 mm Hg). ? People who are overweight or obese. ? People who are African American.  If you are 14-70 years of age, have your blood pressure checked every 3-5 years. If you are 37 years of age or older, have your blood pressure checked every year. You should have your blood pressure measured twice-once when you are at a hospital or clinic, and once when you are not at a hospital or clinic. Record the average of the two measurements. To check your blood pressure when you are not at a hospital or clinic, you can use: ? An automated blood pressure machine at a pharmacy. ? A home blood pressure monitor.  If you are between 23 years and 18 years old, ask your health  care provider if you should take aspirin to prevent strokes.  Have regular diabetes screenings. This involves taking a blood sample to check your fasting blood sugar level. ? If you are at a normal weight and have a low risk for diabetes, have this test once every three years after 42 years of age. ? If you are overweight and have a high risk for diabetes, consider being tested at a younger age or more often. Preventing infection Hepatitis B  If you have a higher risk for hepatitis B, you should be screened for this virus. You are considered at high risk for hepatitis B if: ? You were born in a country where hepatitis B is common. Ask your health care provider which countries are considered high risk. ? Your parents were born in a high-risk country, and you have not been immunized against hepatitis B (hepatitis B vaccine). ? You have HIV or AIDS. ? You use needles to inject street drugs. ? You live with someone who has hepatitis B. ? You have had sex with someone who has hepatitis B. ? You get hemodialysis treatment. ? You take certain medicines for conditions, including cancer, organ transplantation, and autoimmune conditions. Hepatitis C  Blood testing is recommended for: ? Everyone born from [birth year] through 1964. ? Anyone with known risk factors for hepatitis C. Sexually transmitted infections (STIs)  You should be screened for sexually transmitted infections (STIs) including gonorrhea and chlamydia if: ? You are sexually active and are younger than 42 years of age. ? You are older than 42 years of age and your health care provider tells you that you are at risk for this type of infection. ? Your sexual activity has changed since you were last screened and you are at an increased risk for chlamydia or gonorrhea. Ask your health care provider if you are at risk.  If you do not have HIV, but are at risk, it may be recommended that you take a prescription medicine daily to prevent HIV  infection. This is called pre-exposure prophylaxis (PrEP). You are considered at risk if: ? You are sexually active and do not regularly use condoms or know the HIV status of your partner(s). ? You take drugs by injection. ? You are sexually active with a partner who has HIV. Talk with your health care provider about whether you are at high risk of being infected with HIV. If you choose  to begin PrEP, you should first be tested for HIV. You should then be tested every 3 months for as long as you are taking PrEP. Pregnancy  If you are premenopausal and you may become pregnant, ask your health care provider about preconception counseling.  If you may become pregnant, take 400 to 800 micrograms (mcg) of folic acid every day.  If you want to prevent pregnancy, talk to your health care provider about birth control (contraception). Osteoporosis and menopause  Osteoporosis is a disease in which the bones lose minerals and strength with aging. This can result in serious bone fractures. Your risk for osteoporosis can be identified using a bone density scan.  If you are 57 years of age or older, or if you are at risk for osteoporosis and fractures, ask your health care provider if you should be screened.  Ask your health care provider whether you should take a calcium or vitamin D supplement to lower your risk for osteoporosis.  Menopause may have certain physical symptoms and risks.  Hormone replacement therapy may reduce some of these symptoms and risks. Talk to your health care provider about whether hormone replacement therapy is right for you. Follow these instructions at home:  Schedule regular health, dental, and eye exams.  Stay current with your immunizations.  Do not use any tobacco products including cigarettes, chewing tobacco, or electronic cigarettes.  If you are pregnant, do not drink alcohol.  If you are breastfeeding, limit how much and how often you drink alcohol.  Limit  alcohol intake to no more than 1 drink per day for nonpregnant women. One drink equals 12 ounces of beer, 5 ounces of wine, or 1 ounces of hard liquor.  Do not use street drugs.  Do not share needles.  Ask your health care provider for help if you need support or information about quitting drugs.  Tell your health care provider if you often feel depressed.  Tell your health care provider if you have ever been abused or do not feel safe at home. This information is not intended to replace advice given to you by your health care provider. Make sure you discuss any questions you have with your health care provider. Document Released: 02/20/2011 Document Revised: 01/13/2016 Document Reviewed: 05/11/2015 Elsevier Interactive Patient Education  2019 [employer].

## 2019-01-22 ENCOUNTER — Other Ambulatory Visit: Payer: Self-pay | Admitting: Gynecology

## 2019-05-13 ENCOUNTER — Encounter: Payer: Self-pay | Admitting: Gynecology

## 2019-06-06 ENCOUNTER — Encounter: Payer: Commercial Managed Care - PPO | Admitting: Gynecology

## 2019-06-12 ENCOUNTER — Ambulatory Visit (INDEPENDENT_AMBULATORY_CARE_PROVIDER_SITE_OTHER): Payer: Commercial Managed Care - PPO | Admitting: Gynecology

## 2019-06-12 ENCOUNTER — Other Ambulatory Visit: Payer: Self-pay

## 2019-06-12 ENCOUNTER — Encounter: Payer: Self-pay | Admitting: Gynecology

## 2019-06-12 VITALS — BP 118/76 | Ht 63.5 in | Wt 132.0 lb

## 2019-06-12 DIAGNOSIS — Z01419 Encounter for gynecological examination (general) (routine) without abnormal findings: Secondary | ICD-10-CM

## 2019-06-12 DIAGNOSIS — Z23 Encounter for immunization: Secondary | ICD-10-CM | POA: Diagnosis not present

## 2019-06-12 MED ORDER — DESOGESTREL-ETHINYL ESTRADIOL 0.15-0.02/0.01 MG (21/5) PO TABS: 1.0000 | ORAL_TABLET | Freq: Every day | ORAL | 4 refills | Status: DC

## 2019-06-12 MED ORDER — SERTRALINE HCL 100 MG PO TABS: ORAL_TABLET | ORAL | 4 refills | Status: DC

## 2019-06-12 NOTE — Patient Instructions (Signed)
Follow-up for your mammogram beginning of next year  Follow-up for annual exam in 1 year

## 2019-06-12 NOTE — Progress Notes (Signed)
    Kelly Stanley [birth year] [id number]        43 y.o.  [id number] for annual gynecologic exam.  Without gynecologic complaints  Past medical history,surgical history, problem list, medications, allergies, family history and social history were all reviewed and documented as reviewed in the EPIC chart.  ROS:  Performed with pertinent positives and negatives included in the history, assessment and plan.   Additional significant findings : None   Exam: [name] assistant Vitals:   06/12/19 1540  BP: 118/76  Weight: 132 lb (59.9 kg)  Height: 5' 3.5" (1.613 m)   Body mass index is 23.02 kg/m.  General appearance:  Normal affect, orientation and appearance. Skin: Grossly normal HEENT: Without gross lesions.  No cervical or supraclavicular adenopathy. Thyroid normal.  Lungs:  Clear without wheezing, rales or rhonchi Cardiac: RR, without RMG Abdominal:  Soft, nontender, without masses, guarding, rebound, organomegaly or hernia Breasts:  Examined lying and sitting without masses, retractions, discharge or axillary adenopathy. Pelvic:  Ext, BUS, Vagina: Normal  Cervix: Normal  Uterus: Anteverted, normal size, shape and contour, midline and mobile nontender   Adnexa: Without masses or tenderness    Anus and perineum: Normal   Rectovaginal: Normal sphincter tone without palpated masses or tenderness.    Assessment/Plan:  43 y.o. [id number] female for annual gynecologic exam.  With regular menses, oral contraceptives  1. Drospirenone oral contraceptives.  Doing well and wants to continue.  We again discussed possible increased risk of thrombosis.  Also reviewed decrease risk of ovarian cancer noting her mother's history of ovarian cancer.  Athletic, never smoked, comfortable with the risk.  Refill x1 year provided. 2. Maternal history of ovarian cancer.  Genetically tested negative.  No other family members with breast: Ovary or uterine cancer.  We have discussed screening strategies in  the past and the patient has declined. 3. Mammography 09/2018.  Had follow-up ultrasound and diagnostic views to rule out mass.  Reminded patient she is due the beginning of next year.  Breast exam normal today. 4. History of anxiety attacks on Zoloft 50 mg daily.  Doing well with this.  Had been on Xanax but is not using it at this time.  Refill of her Zoloft x1 year provided. 5. Pap smear/HPV 2019.  No Pap smear done today.  History LGSIL 2005 through 2007 with negative colposcopy.  Normal Pap smears since. 6. Health maintenance.  No routine lab work done as patient plans to have it done at her primary provider's office.  Follow-up 1 year, sooner as needed.   [name] MD, 4:32 PM 06/12/2019

## 2019-06-14 ENCOUNTER — Other Ambulatory Visit: Payer: Self-pay | Admitting: Gynecology

## 2019-09-26 ENCOUNTER — Other Ambulatory Visit: Payer: Self-pay | Admitting: Family Medicine

## 2019-09-26 DIAGNOSIS — Z1231 Encounter for screening mammogram for malignant neoplasm of breast: Secondary | ICD-10-CM

## 2019-10-20 NOTE — Progress Notes (Deleted)
[place] at [facility] [address] [place], [place] [address] [phone]  Date:  10/22/2019   Name:  Kelly Stanley   DOB:  07-Mar-1976   MRN:  [id number]  PCP:  Darreld Mclean, MD    Chief Complaint: No chief complaint on file.   History of Present Illness:  Kelly Stanley is a 44 y.o. very pleasant female patient who presents with the following:  Generally healthy young woman here today for physical exam History of anxiety and depression, allergies, orthostatic hypotension  Last seen by myself February 2020  Pap 2019, up-to-date.  Her GYN is Dr. Phineas Real Flu vaccine and tetanus up-to-date Most recent labs 1 year ago Mammogram about 1 year ago  She is married with 2 school-aged children, ages 49 and 61.  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  Still following a vegan diet-if so check B12 Patient Active Problem List   Diagnosis Date Noted  . 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 Allergic contact dermatitis 01/26/2014  . Anxiety and depression 11/17/2013  . Chronic night sweats 11/17/2013  . Family history of ovarian cancer 11/17/2013  . Allergic rhinitis 11/17/2013  . Orthostatic hypotension 11/17/2013    Past Medical History:  Diagnosis Date  . Anxiety   . Syncope     Past Surgical History:  Procedure Laterality Date  . APPENDECTOMY    . KNEE ARTHROSCOPY W/ LATERAL RELEASE Left 1997    Social History   Tobacco Use  . Smoking status: Never Smoker  . Smokeless tobacco: Never Used  Substance Use Topics  . Alcohol use: Yes    Alcohol/week: 0.0 standard drinks    Comment: Occas  . Drug use: No    Family History  Problem Relation Age of Onset  . Cancer Mother        ovarian  . Stroke Maternal Grandfather   . Diabetes Maternal Grandfather   . Breast cancer Cousin 57       paternal 1st  cousin    Allergies  Allergen Reactions  . Codeine Nausea And Vomiting    Medication list has been reviewed and updated.  Current Outpatient Medications on File Prior to Visit  Medication Sig Dispense Refill  . ALPRAZolam (XANAX) 0.25 MG tablet Take 1 tablet (0.25 mg total) by mouth 2 (two) times daily as needed for anxiety. 20 tablet 0  . desogestrel-ethinyl estradiol (VIORELE) 0.15-0.02/0.01 MG (21/5) tablet Take 1 tablet by mouth daily. 3 Package 4  . Multiple Vitamin (MULTIVITAMIN) tablet Take 1 tablet by mouth daily.    . Probiotic Product (PROBIOTIC PO) Take by mouth.    . sertraline (ZOLOFT) 100 MG tablet Take half a pill daily 45 tablet 4   No current facility-administered medications on file prior to visit.    Review of Systems:  As per HPI- otherwise negative.   Physical Examination: There were no vitals filed for this visit. There were no vitals filed for this visit. There is no height or weight on file to calculate BMI. Ideal Body Weight:    GEN: no acute distress. HEENT: Atraumatic, Normocephalic.  Ears and Nose: No external deformity. CV: RRR, No M/G/R. No JVD. No thrill. No extra heart sounds. PULM: CTA B, no wheezes, crackles, rhonchi. No retractions. No resp. distress. No accessory muscle use. ABD: S, NT, ND, +BS. No rebound. No HSM.  EXTR: No c/c/e PSYCH: Normally interactive. Conversant.    Assessment and Plan: *** This visit occurred during the SARS-CoV-2 public health emergency.  Safety protocols were in place, including screening questions prior to the visit, additional usage of staff PPE, and extensive cleaning of exam room while observing appropriate contact time as indicated for disinfecting solutions.    Signed Lamar Blinks, MD

## 2019-10-21 ENCOUNTER — Encounter: Payer: Commercial Managed Care - PPO | Admitting: Family Medicine

## 2019-10-22 ENCOUNTER — Ambulatory Visit (INDEPENDENT_AMBULATORY_CARE_PROVIDER_SITE_OTHER): Payer: Commercial Managed Care - PPO | Admitting: Family Medicine

## 2019-10-22 DIAGNOSIS — Z13220 Encounter for screening for lipoid disorders: Secondary | ICD-10-CM | POA: Diagnosis not present

## 2019-10-22 DIAGNOSIS — Z131 Encounter for screening for diabetes mellitus: Secondary | ICD-10-CM | POA: Diagnosis not present

## 2019-10-22 DIAGNOSIS — Z0000 Encounter for general adult medical examination without abnormal findings: Secondary | ICD-10-CM

## 2019-10-22 DIAGNOSIS — Z130 Encounter for screening for diseases of the blood and blood-forming organs and certain disorders involving the immune mechanism: Secondary | ICD-10-CM

## 2019-10-22 DIAGNOSIS — Z1329 Encounter for screening for other suspected endocrine disorder: Secondary | ICD-10-CM

## 2019-10-22 DIAGNOSIS — E559 Vitamin D deficiency, unspecified: Secondary | ICD-10-CM

## 2019-11-10 NOTE — Progress Notes (Deleted)
[place] at [facility] [address], [place], [place] [address] [phone]  Date:  11/12/2019   Name:  Kelly Stanley   DOB:  [birth year]   MRN:  [id number]  PCP:  Darreld Mclean, MD    Chief Complaint: No chief complaint on file.   History of Present Illness:  Kelly Stanley is a 44 y.o. very pleasant female patient who presents with the following:  Generally healthy woman here today for a physical exam History of some anxiety and depression  Last seen by myself February 2020 Pap is up-to-date-she sees gynecology, Dr. Abbie Sons Immunizations up-to-date,?  Covid series Most recent labs about 1 year ago Mammogram February 2020  She is married, has 2 school-aged children ages 64 and 44 She is a Pharmacist, hospital at [employer] She is very active, enjoys running and soccer Patient Active Problem List   Diagnosis Date Noted  . 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 Allergic contact dermatitis 01/26/2014  . Anxiety and depression 11/17/2013  . Chronic night sweats 11/17/2013  . Family history of ovarian cancer 11/17/2013  . Allergic rhinitis 11/17/2013  . Orthostatic hypotension 11/17/2013    Past Medical History:  Diagnosis Date  . Anxiety   . Syncope     Past Surgical History:  Procedure Laterality Date  . APPENDECTOMY    . KNEE ARTHROSCOPY W/ LATERAL RELEASE Left 1997    Social History   Tobacco Use  . Smoking status: Never Smoker  . Smokeless tobacco: Never Used  Substance Use Topics  . Alcohol use: Yes    Alcohol/week: 0.0 standard drinks    Comment: Occas  . Drug use: No    Family History  Problem Relation Age of Onset  . Cancer Mother        ovarian  . Stroke Maternal Grandfather   . Diabetes Maternal Grandfather   . Breast cancer Cousin 86       paternal 1st cousin    Allergies  Allergen Reactions  . Codeine Nausea And Vomiting    Medication list has been reviewed and  updated.  Current Outpatient Medications on File Prior to Visit  Medication Sig Dispense Refill  . ALPRAZolam (XANAX) 0.25 MG tablet Take 1 tablet (0.25 mg total) by mouth 2 (two) times daily as needed for anxiety. 20 tablet 0  . desogestrel-ethinyl estradiol (VIORELE) 0.15-0.02/0.01 MG (21/5) tablet Take 1 tablet by mouth daily. 3 Package 4  . Multiple Vitamin (MULTIVITAMIN) tablet Take 1 tablet by mouth daily.    . Probiotic Product (PROBIOTIC PO) Take by mouth.    . sertraline (ZOLOFT) 100 MG tablet Take half a pill daily 45 tablet 4   No current facility-administered medications on file prior to visit.    Review of Systems:  As per HPI- otherwise negative.   Physical Examination: There were no vitals filed for this visit. There were no vitals filed for this visit. There is no height or weight on file to calculate BMI. Ideal Body Weight:    GEN: no acute distress. HEENT: Atraumatic, Normocephalic.  Ears and Nose: No external deformity. CV: RRR, No M/G/R. No JVD. No thrill. No extra heart sounds. PULM: CTA B, no wheezes, crackles, rhonchi. No retractions. No resp. distress. No accessory muscle use. ABD: S, NT, ND, +BS. No rebound. No HSM. EXTR: No c/c/e PSYCH: Normally interactive. Conversant.    Assessment and Plan: *** This visit occurred during the SARS-CoV-2 public  health emergency.  Safety protocols were in place, including screening questions prior to the visit, additional usage of staff PPE, and extensive cleaning of exam room while observing appropriate contact time as indicated for disinfecting solutions.    Signed Lamar Blinks, MD

## 2019-11-12 ENCOUNTER — Ambulatory Visit (INDEPENDENT_AMBULATORY_CARE_PROVIDER_SITE_OTHER): Payer: Commercial Managed Care - PPO | Admitting: Family Medicine

## 2019-11-14 ENCOUNTER — Ambulatory Visit: Payer: Commercial Managed Care - PPO

## 2019-11-23 NOTE — Progress Notes (Addendum)
[place] [facility] [address], [place], [place] [address] [phone]  Date:  11/24/2019   Name:  Kelly Stanley   DOB:  Aug 03, 1976   MRN:  [id number]  PCP:  Darreld Mclean, MD    Chief Complaint: Annual Exam   History of Present Illness:  Kelly Stanley is a 44 y.o. very pleasant female patient who presents with the following:  Generally healthy woman here today for a physical exam-this has been rescheduled twice Last seen by myself February 2020  History of allergic rhinitis, orthostatic hypotension, depression and anxiety She has never been a smoker I last saw her for a physical in August 2018 Her GYN is Dr. Juluis Mire he has recently retired, so she will be establishing with a new provider  She is married, has 2 school-age children.  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  She has history of orthostatic hypotension symptoms, and bradycardia She did have covid 19 about a month ago- since that time her breathing is still not 100% normal She still has orthostatic hypotension sx-these may be a bit worse since she had Covid The symptoms seem to come and go-for example she ran 5 miles this morning without much difficulty, but other times may feel quite short of breath.  She is not having chest pain  She follows a vegan/vegetarian diet Labs a year ago looked fine Pap 2019 Mammogram February 2020- will be done today  She is on OCP- LMP was 3 weeks ago  Patient Active Problem List   Diagnosis Date Noted  . 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 Allergic contact dermatitis 01/26/2014  . Anxiety and depression 11/17/2013  . Chronic night sweats 11/17/2013  . Family history of ovarian cancer 11/17/2013  . Allergic rhinitis 11/17/2013  . Orthostatic hypotension 11/17/2013    Past Medical History:   Diagnosis Date  . Anxiety   . Syncope     Past Surgical History:  Procedure Laterality Date  . APPENDECTOMY    . KNEE ARTHROSCOPY W/ LATERAL RELEASE Left 1997    Social History   Tobacco Use  . Smoking status: Never Smoker  . Smokeless tobacco: Never Used  Substance Use Topics  . Alcohol use: Yes    Alcohol/week: 0.0 standard drinks    Comment: Occas  . Drug use: No    Family History  Problem Relation Age of Onset  . Cancer Mother        ovarian  . Stroke Maternal Grandfather   . Diabetes Maternal Grandfather   . Breast cancer Cousin 74       paternal 1st cousin    Allergies  Allergen Reactions  . Codeine Nausea And Vomiting    Medication list has been reviewed and updated.  Current Outpatient Medications on File Prior to Visit  Medication Sig Dispense Refill  . ALPRAZolam (XANAX) 0.25 MG tablet Take 1 tablet (0.25 mg total) by mouth 2 (two) times daily as needed for anxiety. 20 tablet 0  . desogestrel-ethinyl estradiol (VIORELE) 0.15-0.02/0.01 MG (21/5) tablet Take 1 tablet by mouth daily. 3 Package 4  . Multiple Vitamin (MULTIVITAMIN) tablet Take 1 tablet by mouth daily.    . Probiotic Product (PROBIOTIC PO) Take by mouth.    . sertraline (ZOLOFT) 100 MG tablet Take half a pill daily 45 tablet  4   No current facility-administered medications on file prior to visit.    Review of Systems:  As per HPI- otherwise negative.   Physical Examination: Vitals:   11/24/19 1115  BP: 115/77  Pulse: 65  Resp: 16  Temp: (!) 97.4 F (36.3 C)  SpO2: 100%   Vitals:   11/24/19 1115  Weight: 131 lb (59.4 kg)  Height: 5' 3.5" (1.613 m)   Body mass index is 22.84 kg/m. Ideal Body Weight: Weight in (lb) to have BMI = 25: 143.1  GEN: no acute distress.  It build, looks well HEENT: Atraumatic, Normocephalic.  Ears and Nose: No external deformity. CV: RRR, No M/G/R. No JVD. No thrill. No extra heart sounds. PULM: CTA B, no wheezes, crackles, rhonchi. No  retractions. No resp. distress. No accessory muscle use. ABD: S, NT, ND, +BS. No rebound. No HSM. EXTR: No c/c/e PSYCH: Normally interactive. Conversant.   EKG: Mild sinus bradycardia, rate of 58 Compared with tracing from 12/18 no significant change Assessment and Plan: Physical exam  Screening for hyperlipidemia - Plan: Lipid panel  Screening for diabetes mellitus - Plan: Comprehensive metabolic panel, Hemoglobin A1c  Screening for deficiency anemia - Plan: CBC  Vitamin D deficiency - Plan: Vitamin D (25 hydroxy)  Screening for thyroid disorder - Plan: TSH  Vegetarian diet - Plan: B12  Chest tightness - Plan: POCT urine pregnancy, EKG 12-Lead, D-Dimer, Quantitative, DG Chest 2 View, CANCELED: DG Chest 2 View, CANCELED: D-Dimer, Quantitative  Here today for routine physical-labs pending as above Mammogram scheduled for later today She also recently had COVID-19, likely has some residual respiratory symptoms However she is on OCP, has noticed some shortness of breath-need to consider pulmonary embolism Will obtain D-dimer today, if positive consider CT angiogram EKG is reassuring This visit occurred during the SARS-CoV-2 public health emergency.  Safety protocols were in place, including screening questions prior to the visit, additional usage of staff PPE, and extensive cleaning of exam room while observing appropriate contact time as indicated for disinfecting solutions.    Signed Lamar Blinks, MD  Received her labs and chest x-ray as below, message to patient  Results for orders placed or performed in visit on 11/24/19  CBC  Result Value Ref Range   WBC 10.2 4.0 - 10.5 K/uL   RBC 4.27 3.87 - 5.11 Mil/uL   Platelets 187.0 150.0 - 400.0 K/uL   Hemoglobin 13.6 12.0 - 15.0 g/dL   HCT 40.0 36.0 - 46.0 %   MCV 93.7 78.0 - 100.0 fl   MCHC 34.1 30.0 - 36.0 g/dL   RDW 12.4 11.5 - 15.5 %  Comprehensive metabolic panel  Result Value Ref Range   Sodium 139 135 - 145 mEq/L    Potassium 4.1 3.5 - 5.1 mEq/L   Chloride 104 96 - 112 mEq/L   CO2 29 19 - 32 mEq/L   Glucose, Bld 91 70 - 99 mg/dL   BUN 17 6 - 23 mg/dL   Creatinine, Ser 0.84 0.40 - 1.20 mg/dL   Total Bilirubin 0.7 0.2 - 1.2 mg/dL   Alkaline Phosphatase 44 39 - 117 U/L   AST 23 0 - 37 U/L   ALT 17 0 - 35 U/L   Total Protein 6.6 6.0 - 8.3 g/dL   Albumin 4.1 3.5 - 5.2 g/dL   GFR 74.18 >60.00 mL/min   Calcium 9.3 8.4 - 10.5 mg/dL  Hemoglobin A1c  Result Value Ref Range   Hgb A1c MFr Bld 5.4 4.6 -  6.5 %  Lipid panel  Result Value Ref Range   Cholesterol 194 0 - 200 mg/dL   Triglycerides 142.0 0.0 - 149.0 mg/dL   HDL 75.00 >39.00 mg/dL   VLDL 28.4 0.0 - 40.0 mg/dL   LDL Cholesterol 90 0 - 99 mg/dL   Total CHOL/HDL Ratio 3    NonHDL 118.85   TSH  Result Value Ref Range   TSH 1.77 0.35 - 4.50 uIU/mL  B12  Result Value Ref Range   Vitamin B-12 223 211 - 911 pg/mL  Vitamin D (25 hydroxy)  Result Value Ref Range   VITD 67.52 30.00 - 100.00 ng/mL  D-Dimer, Quantitative  Result Value Ref Range   D-Dimer, Quant 0.20 <0.50 mcg/mL FEU  POCT urine pregnancy  Result Value Ref Range   Preg Test, Ur Negative Negative   DG Chest 2 View  Result Date: 11/24/2019 CLINICAL DATA:  Recent COVID EXAM: CHEST - 2 VIEW COMPARISON:  July 23, 2017 FINDINGS: The heart size and mediastinal contours are within normal limits. Both lungs are clear. The visualized skeletal structures are unremarkable. IMPRESSION: No active cardiopulmonary disease. Electronically Signed   By: Prudencio Pair M.D.   On: 11/24/2019 15:36   MM 3D SCREEN BREAST BILATERAL  Result Date: 11/24/2019 CLINICAL DATA:  Screening. EXAM: DIGITAL SCREENING BILATERAL MAMMOGRAM WITH TOMO AND CAD COMPARISON:  Previous exam(s). ACR Breast Density Category d: The breast tissue is extremely dense, which lowers the sensitivity of mammography. FINDINGS: In the right breast, possible distortion warrants further evaluation. In the left breast, no findings  suspicious for malignancy. Images were processed with CAD. IMPRESSION: Further evaluation is suggested for possible distortion in the right breast. RECOMMENDATION: Diagnostic mammogram and possibly ultrasound of the right breast. (Code:[id number]) The patient will be contacted regarding the findings, and additional imaging will be scheduled. BI-RADS CATEGORY  0: Incomplete. Need additional imaging evaluation and/or prior mammograms for comparison. Electronically Signed   By: Ammie Ferrier M.D.   On: 11/24/2019 16:21

## 2019-11-23 NOTE — Patient Instructions (Signed)
It was good to see you again today!  I will be in touch with your labs asap If your D dimer is positive we will want to consider getting a CT angiogram of your lungs to rule out a blood clot. We will get a plain x-ray for now   Health Maintenance, Female Adopting a healthy lifestyle and getting preventive care are important in promoting health and wellness. Ask your health care provider about:  The right schedule for you to have regular tests and exams.  Things you can do on your own to prevent diseases and keep yourself healthy. What should I know about diet, weight, and exercise? Eat a healthy diet   Eat a diet that includes plenty of vegetables, fruits, low-fat dairy products, and lean protein.  Do not eat a lot of foods that are high in solid fats, added sugars, or sodium. Maintain a healthy weight Body mass index (BMI) is used to identify weight problems. It estimates body fat based on height and weight. Your health care provider can help determine your BMI and help you achieve or maintain a healthy weight. Get regular exercise Get regular exercise. This is one of the most important things you can do for your health. Most adults should:  Exercise for at least 150 minutes each week. The exercise should increase your heart rate and make you sweat (moderate-intensity exercise).  Do strengthening exercises at least twice a week. This is in addition to the moderate-intensity exercise.  Spend less time sitting. Even light physical activity can be beneficial. Watch cholesterol and blood lipids Have your blood tested for lipids and cholesterol at 43 years of age, then have this test every 5 years. Have your cholesterol levels checked more often if:  Your lipid or cholesterol levels are high.  You are older than 43 years of age.  You are at high risk for heart disease. What should I know about cancer screening? Depending on your health history and family history, you may need to  have cancer screening at various ages. This may include screening for:  Breast cancer.  Cervical cancer.  Colorectal cancer.  Skin cancer.  Lung cancer. What should I know about heart disease, diabetes, and high blood pressure? Blood pressure and heart disease  High blood pressure causes heart disease and increases the risk of stroke. This is more likely to develop in people who have high blood pressure readings, are of African descent, or are overweight.  Have your blood pressure checked: ? Every 3-5 years if you are 26-57 years of age. ? Every year if you are 26 years old or older. Diabetes Have regular diabetes screenings. This checks your fasting blood sugar level. Have the screening done:  Once every three years after age 47 if you are at a normal weight and have a low risk for diabetes.  More often and at a younger age if you are overweight or have a high risk for diabetes. What should I know about preventing infection? Hepatitis B If you have a higher risk for hepatitis B, you should be screened for this virus. Talk with your health care provider to find out if you are at risk for hepatitis B infection. Hepatitis C Testing is recommended for:  Everyone born from [birth year] through 1965.  Anyone with known risk factors for hepatitis C. Sexually transmitted infections (STIs)  Get screened for STIs, including gonorrhea and chlamydia, if: ? You are sexually active and are younger than 43 years of age. ?  You are older than 43 years of age and your health care provider tells you that you are at risk for this type of infection. ? Your sexual activity has changed since you were last screened, and you are at increased risk for chlamydia or gonorrhea. Ask your health care provider if you are at risk.  Ask your health care provider about whether you are at high risk for HIV. Your health care provider may recommend a prescription medicine to help prevent HIV infection. If you choose to  take medicine to prevent HIV, you should first get tested for HIV. You should then be tested every 3 months for as long as you are taking the medicine. Pregnancy  If you are about to stop having your period (premenopausal) and you may become pregnant, seek counseling before you get pregnant.  Take 400 to 800 micrograms (mcg) of folic acid every day if you become pregnant.  Ask for birth control (contraception) if you want to prevent pregnancy. Osteoporosis and menopause Osteoporosis is a disease in which the bones lose minerals and strength with aging. This can result in bone fractures. If you are 61 years old or older, or if you are at risk for osteoporosis and fractures, ask your health care provider if you should:  Be screened for bone loss.  Take a calcium or vitamin D supplement to lower your risk of fractures.  Be given hormone replacement therapy (HRT) to treat symptoms of menopause. Follow these instructions at home: Lifestyle  Do not use any products that contain nicotine or tobacco, such as cigarettes, e-cigarettes, and chewing tobacco. If you need help quitting, ask your health care provider.  Do not use street drugs.  Do not share needles.  Ask your health care provider for help if you need support or information about quitting drugs. Alcohol use  Do not drink alcohol if: ? Your health care provider tells you not to drink. ? You are pregnant, may be pregnant, or are planning to become pregnant.  If you drink alcohol: ? Limit how much you use to 0-1 drink a day. ? Limit intake if you are breastfeeding.  Be aware of how much alcohol is in your drink. In the [place] one drink equals one 12 oz bottle of beer (355 mL), one 5 oz glass of wine (148 mL), or one 1 oz glass of hard liquor (44 mL). General instructions  Schedule regular health, dental, and eye exams.  Stay current with your vaccines.  Tell your health care provider if: ? You often feel depressed. ? You  have ever been abused or do not feel safe at home. Summary  Adopting a healthy lifestyle and getting preventive care are important in promoting health and wellness.  Follow your health care provider's instructions about healthy diet, exercising, and getting tested or screened for diseases.  Follow your health care provider's instructions on monitoring your cholesterol and blood pressure. This information is not intended to replace advice given to you by your health care provider. Make sure you discuss any questions you have with your health care provider. Document Revised: 07/31/2018 Document Reviewed: 07/31/2018 Elsevier Patient Education  2020 [employer].

## 2019-11-24 ENCOUNTER — Ambulatory Visit
Admission: RE | Admit: 2019-11-24 | Discharge: 2019-11-24 | Disposition: A | Discharge status: Home / Self Care | Payer: Commercial Managed Care - PPO | Source: Ambulatory Visit | Attending: Family Medicine | Admitting: Family Medicine

## 2019-11-24 ENCOUNTER — Encounter: Payer: Self-pay | Admitting: Family Medicine

## 2019-11-24 ENCOUNTER — Other Ambulatory Visit: Payer: Self-pay

## 2019-11-24 ENCOUNTER — Ambulatory Visit (INDEPENDENT_AMBULATORY_CARE_PROVIDER_SITE_OTHER): Payer: Commercial Managed Care - PPO | Admitting: Family Medicine

## 2019-11-24 VITALS — BP 115/77 | HR 65 | Temp 97.4°F | Resp 16 | Ht 63.5 in | Wt 131.0 lb

## 2019-11-24 DIAGNOSIS — Z131 Encounter for screening for diabetes mellitus: Secondary | ICD-10-CM

## 2019-11-24 DIAGNOSIS — Z130 Encounter for screening for diseases of the blood and blood-forming organs and certain disorders involving the immune mechanism: Secondary | ICD-10-CM

## 2019-11-24 DIAGNOSIS — Z0000 Encounter for general adult medical examination without abnormal findings: Secondary | ICD-10-CM | POA: Diagnosis not present

## 2019-11-24 DIAGNOSIS — Z13220 Encounter for screening for lipoid disorders: Secondary | ICD-10-CM | POA: Diagnosis not present

## 2019-11-24 DIAGNOSIS — Z789 Other specified health status: Secondary | ICD-10-CM | POA: Diagnosis not present

## 2019-11-24 DIAGNOSIS — R0789 Other chest pain: Secondary | ICD-10-CM

## 2019-11-24 DIAGNOSIS — Z1231 Encounter for screening mammogram for malignant neoplasm of breast: Secondary | ICD-10-CM

## 2019-11-24 DIAGNOSIS — E559 Vitamin D deficiency, unspecified: Secondary | ICD-10-CM | POA: Diagnosis not present

## 2019-11-24 DIAGNOSIS — Z1329 Encounter for screening for other suspected endocrine disorder: Secondary | ICD-10-CM | POA: Diagnosis not present

## 2019-11-24 LAB — LIPID PANEL
Cholesterol: 194 mg/dL (ref 0–200)
HDL: 75 mg/dL (ref >39.00)
LDL Cholesterol: 90 mg/dL (ref 0–99)
NonHDL: 118.85
Total CHOL/HDL Ratio: 3
Triglycerides: 142 mg/dL (ref 0.0–149.0)
VLDL: 28.4 mg/dL (ref 0.0–40.0)

## 2019-11-24 LAB — CBC
HCT: 40 % (ref 36.0–46.0)
Hemoglobin: 13.6 g/dL (ref 12.0–15.0)
MCHC: 34.1 g/dL (ref 30.0–36.0)
MCV: 93.7 fl (ref 78.0–100.0)
Platelets: 187 10*3/uL (ref 150.0–400.0)
RBC: 4.27 Mil/uL (ref 3.87–5.11)
RDW: 12.4 % (ref 11.5–15.5)
WBC: 10.2 10*3/uL (ref 4.0–10.5)

## 2019-11-24 LAB — POCT URINE PREGNANCY: Preg Test, Ur: NEGATIVE

## 2019-11-24 LAB — COMPREHENSIVE METABOLIC PANEL
ALT: 17 U/L (ref 0–35)
AST: 23 U/L (ref 0–37)
Albumin: 4.1 g/dL (ref 3.5–5.2)
Alkaline Phosphatase: 44 U/L (ref 39–117)
BUN: 17 mg/dL (ref 6–23)
CO2: 29 mEq/L (ref 19–32)
Calcium: 9.3 mg/dL (ref 8.4–10.5)
Chloride: 104 mEq/L (ref 96–112)
Creatinine, Ser: 0.84 mg/dL (ref 0.40–1.20)
GFR: 74.18 mL/min (ref >60.00)
Glucose, Bld: 91 mg/dL (ref 70–99)
Potassium: 4.1 mEq/L (ref 3.5–5.1)
Sodium: 139 mEq/L (ref 135–145)
Total Bilirubin: 0.7 mg/dL (ref 0.2–1.2)
Total Protein: 6.6 g/dL (ref 6.0–8.3)

## 2019-11-24 LAB — HEMOGLOBIN A1C: Hgb A1c MFr Bld: 5.4 % (ref 4.6–6.5)

## 2019-11-24 LAB — VITAMIN B12: Vitamin B-12: 223 pg/mL (ref 211–911)

## 2019-11-24 LAB — D-DIMER, QUANTITATIVE: D-Dimer, Quant: 0.2 mcg/mL FEU (ref <0.50)

## 2019-11-24 LAB — TSH: TSH: 1.77 u[IU]/mL (ref 0.35–4.50)

## 2019-11-24 LAB — VITAMIN D 25 HYDROXY (VIT D DEFICIENCY, FRACTURES): VITD: 67.52 ng/mL (ref 30.00–100.00)

## 2019-11-25 ENCOUNTER — Other Ambulatory Visit: Payer: Self-pay | Admitting: Family Medicine

## 2019-11-25 DIAGNOSIS — R928 Other abnormal and inconclusive findings on diagnostic imaging of breast: Secondary | ICD-10-CM

## 2019-11-28 ENCOUNTER — Other Ambulatory Visit: Payer: Self-pay

## 2019-11-28 ENCOUNTER — Ambulatory Visit: Admission: RE | Admit: 2019-11-28 | Payer: Commercial Managed Care - PPO | Source: Ambulatory Visit

## 2019-11-28 ENCOUNTER — Ambulatory Visit
Admission: RE | Admit: 2019-11-28 | Discharge: 2019-11-28 | Disposition: A | Discharge status: Home / Self Care | Payer: Commercial Managed Care - PPO | Source: Ambulatory Visit | Attending: Family Medicine | Admitting: Family Medicine

## 2019-11-28 DIAGNOSIS — R928 Other abnormal and inconclusive findings on diagnostic imaging of breast: Secondary | ICD-10-CM

## 2020-06-04 IMAGING — MG DIGITAL SCREENING BILATERAL MAMMOGRAM WITH TOMO AND CAD
8 series · 9 of 24 positions shown · non-contrast
Comparison: Previous exam(s).

CLINICAL DATA: Screening.

EXAM:
DIGITAL SCREENING BILATERAL MAMMOGRAM WITH TOMO AND CAD

[R CC synth-2D]
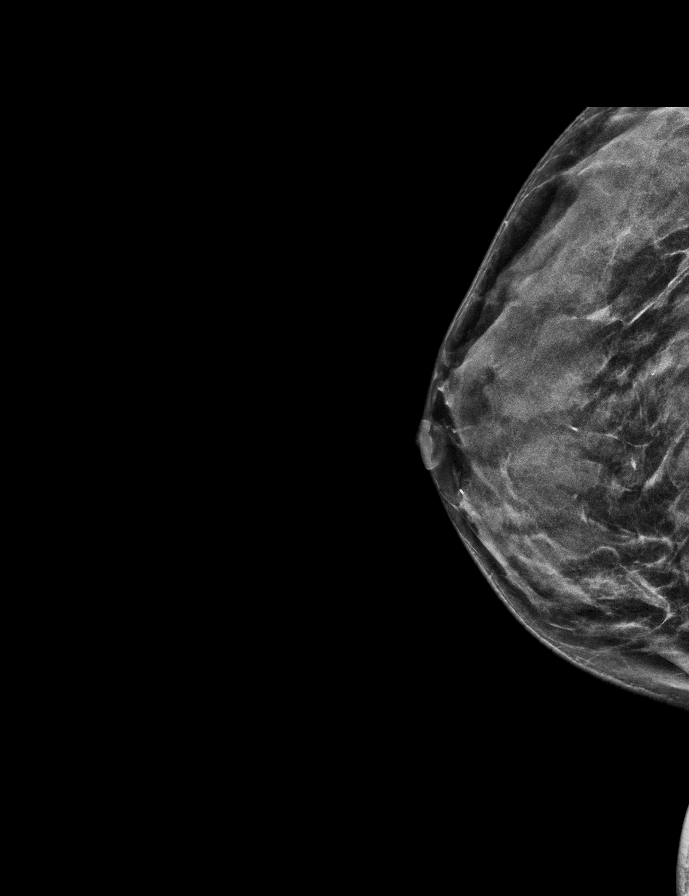

[L CC synth-2D]
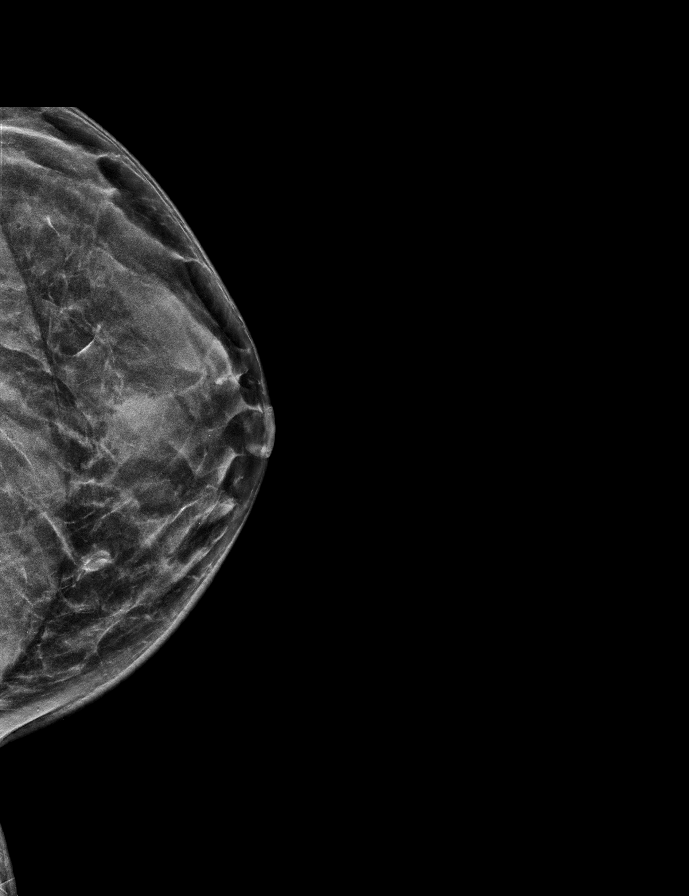

[L MLO synth-2D]
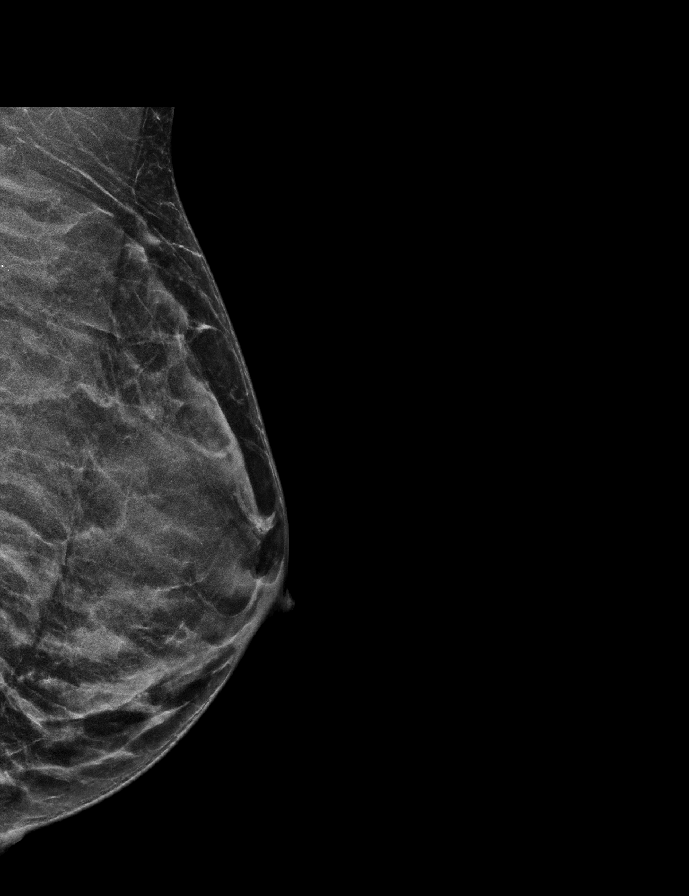

[R MLO synth-2D]
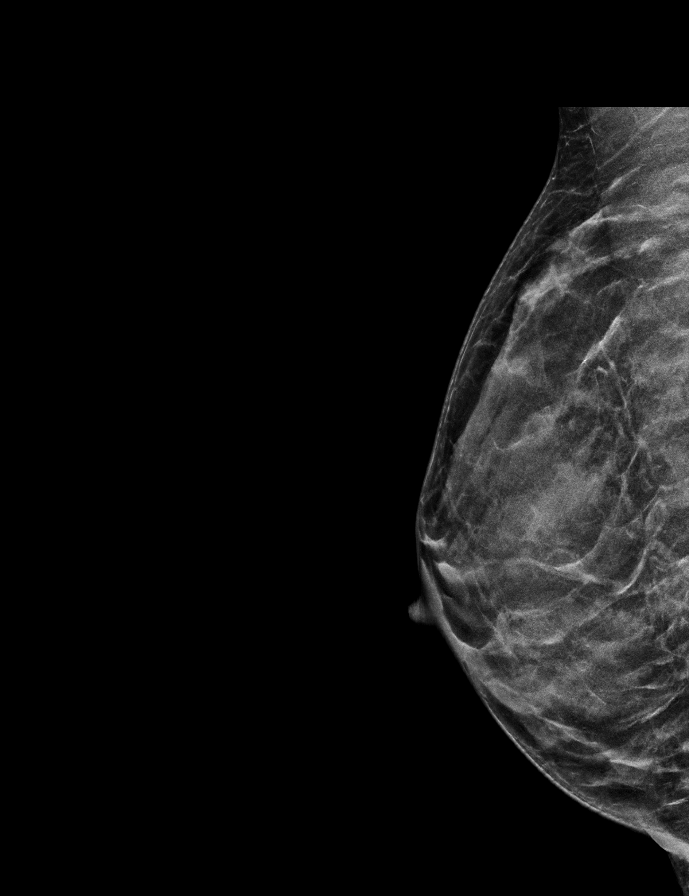

[R MLO tomo · 2 of 42 frames shown]
[frame 14/42]
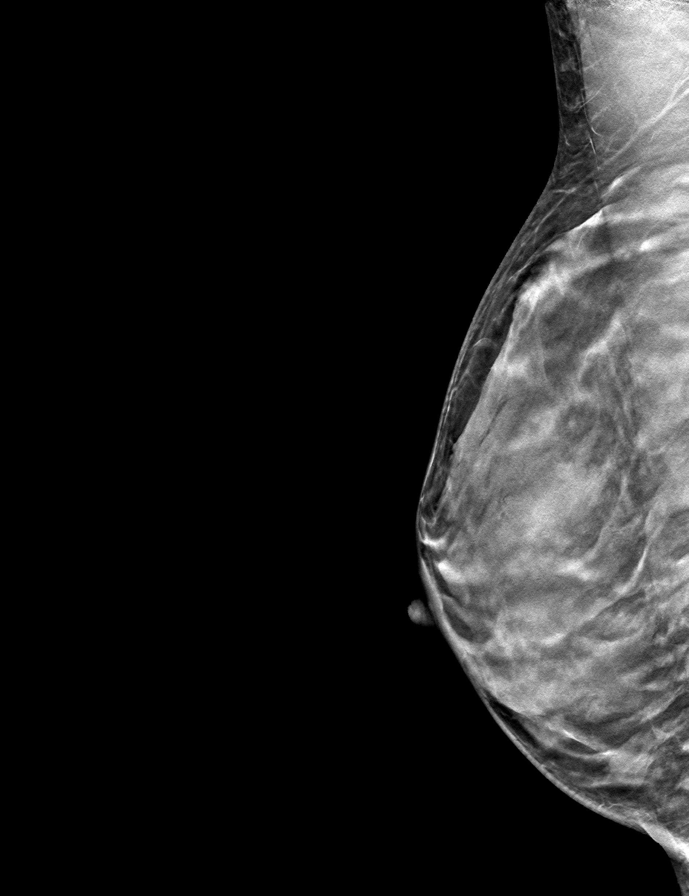
[frame 21/42]
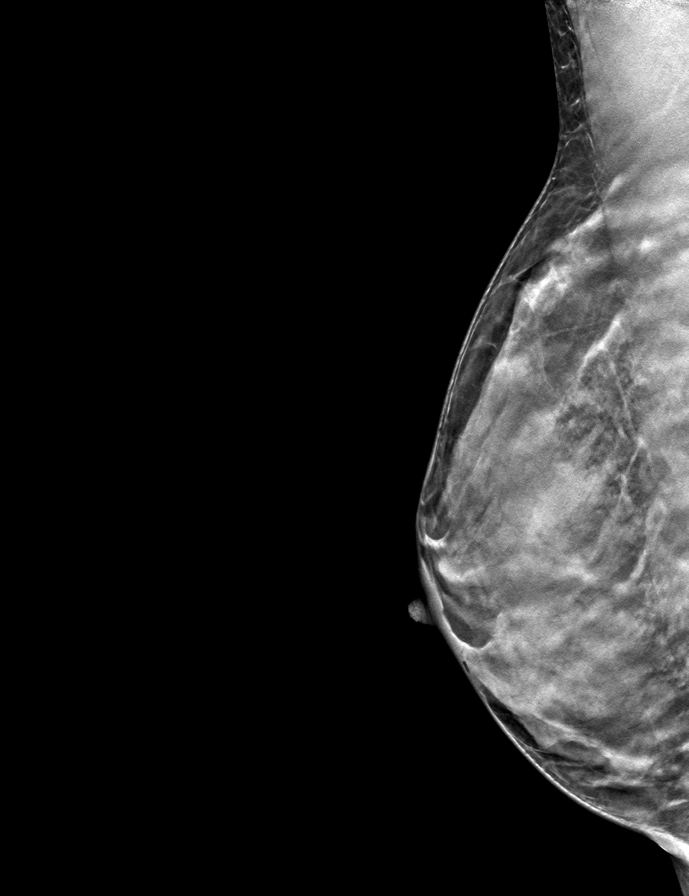

[R CC tomo · tomo slice 21/42.0]
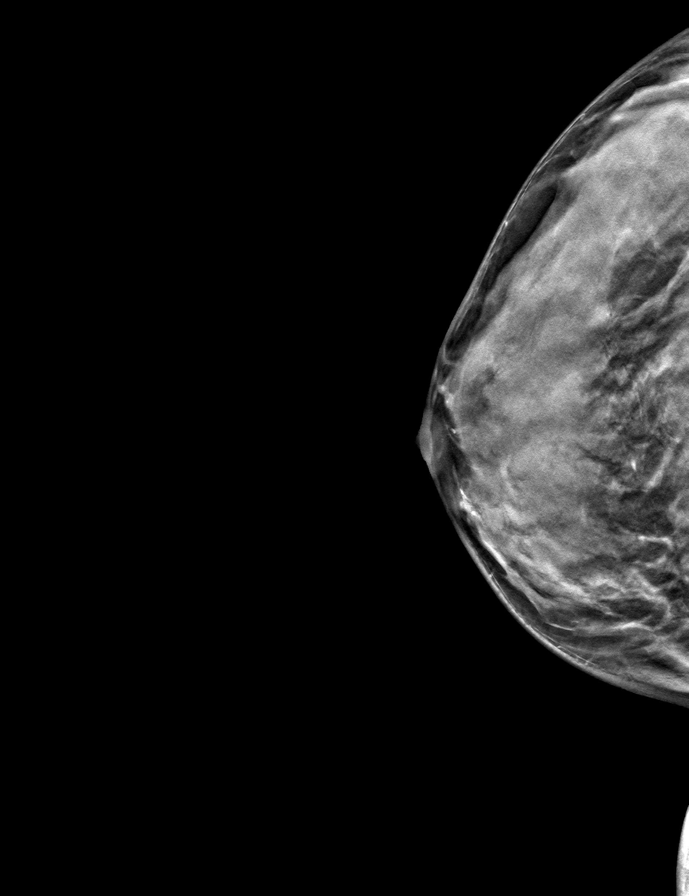

[L MLO tomo · tomo slice 23/45.0]
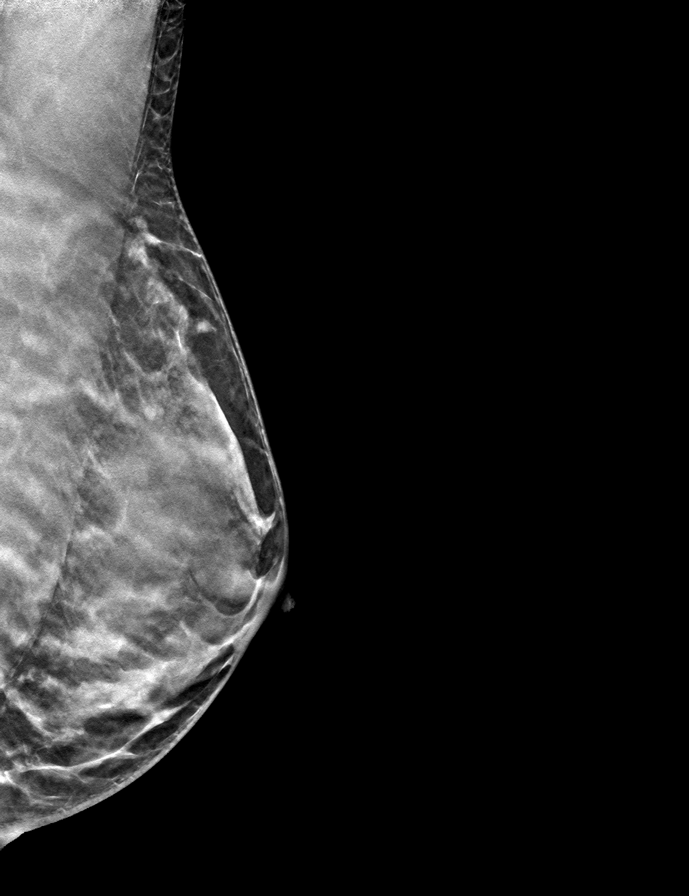

[L CC tomo · tomo slice 24/47.0]
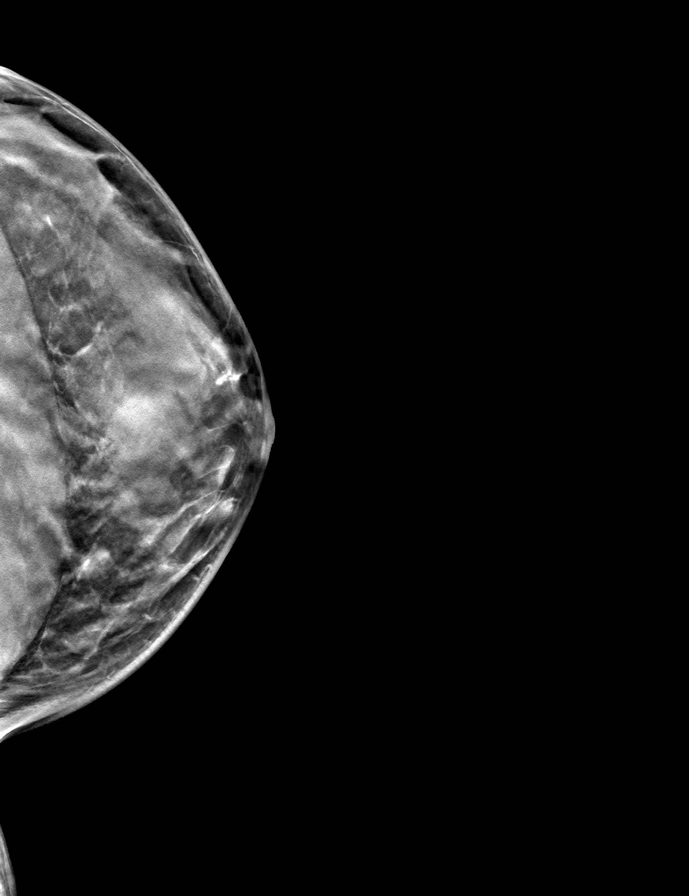

[9 of 24 positions shown; findings below may reference images not displayed]

ACR Breast Density Category d: The breast tissue is extremely dense,
which lowers the sensitivity of mammography.
FINDINGS: In the left breast, a possible asymmetry warrants further
evaluation. In the right breast, no findings suspicious for
malignancy. Images were processed with CAD.
IMPRESSION: Further evaluation is suggested for possible asymmetry in the left
breast.

RECOMMENDATION:
Diagnostic mammogram and possibly ultrasound of the left breast.
(Code:[id number])

The patient will be contacted regarding the findings, and additional
imaging will be scheduled.

BI-RADS CATEGORY  0: Incomplete. Need additional imaging evaluation
and/or prior mammograms for comparison.

## 2020-06-22 ENCOUNTER — Other Ambulatory Visit: Payer: Self-pay

## 2020-06-23 ENCOUNTER — Telehealth: Payer: Self-pay | Admitting: *Deleted

## 2020-06-23 MED ORDER — DESOGESTREL-ETHINYL ESTRADIOL 0.15-0.02/0.01 MG (21/5) PO TABS: 1.0000 | ORAL_TABLET | Freq: Every day | ORAL | 0 refills | Status: DC

## 2020-06-23 NOTE — Telephone Encounter (Signed)
Patient has annual exam scheduled on 07/26/20 need refill on birth control pills. Rx sent.

## 2020-07-26 ENCOUNTER — Other Ambulatory Visit: Payer: Self-pay

## 2020-07-26 ENCOUNTER — Ambulatory Visit (INDEPENDENT_AMBULATORY_CARE_PROVIDER_SITE_OTHER): Payer: Commercial Managed Care - PPO | Admitting: Obstetrics and Gynecology

## 2020-07-26 ENCOUNTER — Encounter: Payer: Self-pay | Admitting: Obstetrics and Gynecology

## 2020-07-26 VITALS — BP 118/76 | Ht 64.0 in | Wt 133.0 lb

## 2020-07-26 DIAGNOSIS — Z01419 Encounter for gynecological examination (general) (routine) without abnormal findings: Secondary | ICD-10-CM

## 2020-07-26 MED ORDER — DESOGESTREL-ETHINYL ESTRADIOL 0.15-0.02/0.01 MG (21/5) PO TABS
1.0000 | ORAL_TABLET | Freq: Every day | ORAL | 4 refills | Status: DC
Start: 2020-07-26 — End: 2021-08-02

## 2020-07-26 MED ORDER — SERTRALINE HCL 100 MG PO TABS: ORAL_TABLET | ORAL | 4 refills | Status: DC

## 2020-07-26 NOTE — Progress Notes (Signed)
   Kelly Stanley 12/25/1975 [id number]  SUBJECTIVE:  44 y.o. [id number] female for annual routine gynecologic exam. She has no gynecologic concerns.  Current Outpatient Medications  Medication Sig Dispense Refill  . ALPRAZolam (XANAX) 0.25 MG tablet Take 1 tablet (0.25 mg total) by mouth 2 (two) times daily as needed for anxiety. 20 tablet 0  . desogestrel-ethinyl estradiol (VIORELE) 0.15-0.02/0.01 MG (21/5) tablet Take 1 tablet by mouth daily. 84 tablet 0  . Multiple Vitamin (MULTIVITAMIN) tablet Take 1 tablet by mouth daily.    . Probiotic Product (PROBIOTIC PO) Take by mouth.    . sertraline (ZOLOFT) 100 MG tablet Take half a pill daily 45 tablet 4   No current facility-administered medications for this visit.   Allergies: Codeine  Patient's last menstrual period was 07/12/2020.  Past medical history,surgical history, problem list, medications, allergies, family history and social history were all reviewed and documented as reviewed in the EPIC chart.  ROS: Pertinent positives and negatives as reviewed in HPI   OBJECTIVE:  BP 118/76   Ht 5\' 4"  (1.626 m)   Wt 133 lb (60.3 kg)   LMP 07/12/2020   BMI 22.83 kg/m  The patient appears well, alert, oriented, in no distress. ENT normal.  Neck supple. No cervical or supraclavicular adenopathy or thyromegaly.  Lungs are clear, good air entry, no wheezes, rhonchi or rales. S1 and S2 normal, no murmurs, regular rate and rhythm.  Abdomen soft without tenderness, guarding, mass or organomegaly.  Neurological is normal, no focal findings.  BREAST EXAM: breasts appear normal, no suspicious masses, no skin or nipple changes or axillary nodes  PELVIC EXAM: VULVA: normal appearing vulva with no masses, tenderness or lesions, VAGINA: normal appearing vagina with normal color and discharge, no lesions, CERVIX: normal appearing cervix without discharge or lesions, UTERUS: uterus is normal size, shape, consistency and nontender, ADNEXA: normal  adnexa in size, nontender and no masses Chaperone: [name] present during the examination  ASSESSMENT:  44 y.o. [name] here for annual gynecologic exam  PLAN:   1. No hormonal or menstrual concerns. 2. Pap smear/HPV 2019.  History LGSIL 2005-2007, negative colposcopy at that time.  Normal Pap smears since then.  Next Pap smear due 2024 following the current guidelines recommending the 5 year interval. 3. Contraception.  Continues on his desogestrel-EE contraceptives.  Active and non-smoker, theoretically lower risk of thrombosis overall.  Benefit of ovarian cancer reduction risk.  Refill x1 year provided at her request. 4.  Mammogram 11/2019.  Breast exam normal.  Continue with annual mammography. 5.  Maternal history of ovarian cancer and reportedly genetic testing negative.  No other family members with breast, ovarian, or uterine cancers.  No further screening is desired per previous discussions. 6. Anxiety.  Uses Zoloft 50 mg daily and does well overall.  Fill x1 year provided at her request.  Has had a small supply of Xanax but rarely needs to use it so she does not require refill of that today. 7. Health maintenance.  No labs today as she normally has these completed with her primary care provider.    Return annually or sooner, prn.  Joseph Pierini MD 07/26/20

## 2020-10-25 ENCOUNTER — Other Ambulatory Visit: Payer: Self-pay | Admitting: Family Medicine

## 2020-10-25 DIAGNOSIS — Z1231 Encounter for screening mammogram for malignant neoplasm of breast: Secondary | ICD-10-CM

## 2020-11-21 NOTE — Progress Notes (Addendum)
[facility] [address], [place], [place] [address] [phone] [id number]  Date:  11/25/2020   Name:  Kelly Stanley   DOB:  12-13-1975   MRN:  [id number]  PCP:  Darreld Mclean, MD    Chief Complaint: Annual Exam   History of Present Illness:  Kelly Stanley is a 45 y.o. very pleasant female patient who presents with the following:  Pt here today for a CPE history of allergies, anxiety/ depression Last seen by myself about one year ago- she been doing well  Married with children agres 12 and 10- she is a Pharmacist, hospital at [employer] She took the day off for her visit today!  Enjoys running and soccer for exercise   Hep C sreening covid series- not done, encouraged pt to get this done Pap UTD- per her GYN  mammo- this is scheduled  Labs one year ago   Doing well on current dose of zoloft No CP or SOB  She is still very active in sports, running and exercise   Patient Active Problem List   Diagnosis Date Noted  . 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 Allergic contact dermatitis 01/26/2014  . Anxiety and depression 11/17/2013  . Chronic night sweats 11/17/2013  . Family history of ovarian cancer 11/17/2013  . Allergic rhinitis 11/17/2013  . Orthostatic hypotension 11/17/2013    Past Medical History:  Diagnosis Date  . Anxiety   . Syncope     Past Surgical History:  Procedure Laterality Date  . APPENDECTOMY    . KNEE ARTHROSCOPY W/ LATERAL RELEASE Left 1997    Social History   Tobacco Use  . Smoking status: Never Smoker  . Smokeless tobacco: Never Used  Vaping Use  . Vaping Use: Never used  Substance Use Topics  . Alcohol use: Yes    Alcohol/week: 0.0 standard drinks    Comment: Occas  . Drug use: No    Family History  Problem Relation Age of Onset  . Ovarian cancer Mother   . Stroke Maternal Grandfather   . Diabetes Maternal Grandfather   . Breast cancer Cousin 34        paternal 1st cousin  . Breast cancer Paternal Aunt 66    Allergies  Allergen Reactions  . Codeine Nausea And Vomiting    Medication list has been reviewed and updated.  Current Outpatient Medications on File Prior to Visit  Medication Sig Dispense Refill  . desogestrel-ethinyl estradiol (VIORELE) 0.15-0.02/0.01 MG (21/5) tablet Take 1 tablet by mouth daily. 84 tablet 4  . sertraline (ZOLOFT) 100 MG tablet Take half a pill daily 45 tablet 4   No current facility-administered medications on file prior to visit.    Review of Systems:  As per HPI- otherwise negative.   Physical Examination: Vitals:   11/25/20 1109  BP: 114/70  Pulse: 64  Resp: 16  Temp: (!) 97.5 F (36.4 C)  SpO2: 99%   Vitals:   11/25/20 1109  Weight: 131 lb (59.4 kg)  Height: 5\' 4"  (1.626 m)   Body mass index is 22.49 kg/m. Ideal Body Weight: Weight in (lb) to have BMI = 25: 145.3  GEN: no acute distress. Normal weight looks well  HEENT: Atraumatic, Normocephalic.  Bilateral TM wnl, oropharynx normal.  PEERL,EOMI.  Tender over left frontal sinuses, redness of nasal tubinates  Ears and Nose: No external deformity. CV: RRR, No M/G/R. No JVD. No thrill. No extra heart  sounds. PULM: CTA B, no wheezes, crackles, rhonchi. No retractions. No resp. distress. No accessory muscle use. ABD: S, NT, ND, +BS. No rebound. No HSM. EXTR: No c/c/e PSYCH: Normally interactive. Conversant.    Assessment and Plan: Physical exam  Fatigue, unspecified type - Plan: TSH, VITAMIN D 25 Hydroxy (Vit-D Deficiency, Fractures)  Screening for hyperlipidemia - Plan: Lipid panel  Screening for diabetes mellitus - Plan: Comprehensive metabolic panel, Hemoglobin A1c  Screening for deficiency anemia - Plan: CBC  Vitamin D deficiency - Plan: VITAMIN D 25 Hydroxy (Vit-D Deficiency, Fractures)  Screening for thyroid disorder - Plan: TSH  Vegetarian diet  Acute non-recurrent frontal sinusitis - Plan: amoxicillin (AMOXIL)  500 MG capsule  Anxiety and depression - Plan: sertraline (ZOLOFT) 100 MG tablet  CPE today Encouraged healthy diet and exercise, encouraged covid 19 series Will plan further follow- up pending labs. Pt also notes that she was ill with flu like sx a few weeks ago- she now has sinus tendernes on the left.  Will treat with amoxicillin  This visit occurred during the SARS-CoV-2 public health emergency.  Safety protocols were in place, including screening questions prior to the visit, additional usage of staff PPE, and extensive cleaning of exam room while observing appropriate contact time as indicated for disinfecting solutions.      Signed Lamar Blinks, MD  Received her labs as below, message to patient  Results for orders placed or performed in visit on 11/25/20  CBC  Result Value Ref Range   WBC 9.8 4.0 - 10.5 K/uL   RBC 4.24 3.87 - 5.11 Mil/uL   Platelets 250.0 150.0 - 400.0 K/uL   Hemoglobin 13.3 12.0 - 15.0 g/dL   HCT 39.4 36.0 - 46.0 %   MCV 92.9 78.0 - 100.0 fl   MCHC 33.7 30.0 - 36.0 g/dL   RDW 11.9 11.5 - 15.5 %  Comprehensive metabolic panel  Result Value Ref Range   Sodium 139 135 - 145 mEq/L   Potassium 4.0 3.5 - 5.1 mEq/L   Chloride 102 96 - 112 mEq/L   CO2 30 19 - 32 mEq/L   Glucose, Bld 83 70 - 99 mg/dL   BUN 16 6 - 23 mg/dL   Creatinine, Ser 0.84 0.40 - 1.20 mg/dL   Total Bilirubin 0.4 0.2 - 1.2 mg/dL   Alkaline Phosphatase 53 39 - 117 U/L   AST 19 0 - 37 U/L   ALT 17 0 - 35 U/L   Total Protein 6.6 6.0 - 8.3 g/dL   Albumin 3.9 3.5 - 5.2 g/dL   GFR 85.07 >60.00 mL/min   Calcium 9.2 8.4 - 10.5 mg/dL  Hemoglobin A1c  Result Value Ref Range   Hgb A1c MFr Bld 5.6 4.6 - 6.5 %  Lipid panel  Result Value Ref Range   Cholesterol 185 0 - 200 mg/dL   Triglycerides 100.0 0.0 - 149.0 mg/dL   HDL 76.70 >39.00 mg/dL   VLDL 20.0 0.0 - 40.0 mg/dL   LDL Cholesterol 88 0 - 99 mg/dL   Total CHOL/HDL Ratio 2    NonHDL 108.40   TSH  Result Value Ref Range    TSH 1.65 0.35 - 4.50 uIU/mL  VITAMIN D 25 Hydroxy (Vit-D Deficiency, Fractures)  Result Value Ref Range   VITD 43.97 30.00 - 100.00 ng/mL

## 2020-11-25 ENCOUNTER — Other Ambulatory Visit: Payer: Self-pay

## 2020-11-25 ENCOUNTER — Encounter: Payer: Self-pay | Admitting: Family Medicine

## 2020-11-25 ENCOUNTER — Ambulatory Visit (INDEPENDENT_AMBULATORY_CARE_PROVIDER_SITE_OTHER): Payer: Commercial Managed Care - PPO | Admitting: Family Medicine

## 2020-11-25 VITALS — BP 114/70 | HR 64 | Temp 97.5°F | Resp 16 | Ht 64.0 in | Wt 131.0 lb

## 2020-11-25 DIAGNOSIS — Z114 Encounter for screening for human immunodeficiency virus [HIV]: Secondary | ICD-10-CM

## 2020-11-25 DIAGNOSIS — Z130 Encounter for screening for diseases of the blood and blood-forming organs and certain disorders involving the immune mechanism: Secondary | ICD-10-CM | POA: Diagnosis not present

## 2020-11-25 DIAGNOSIS — F32A Depression, unspecified: Secondary | ICD-10-CM

## 2020-11-25 DIAGNOSIS — R5383 Other fatigue: Secondary | ICD-10-CM

## 2020-11-25 DIAGNOSIS — Z131 Encounter for screening for diabetes mellitus: Secondary | ICD-10-CM | POA: Diagnosis not present

## 2020-11-25 DIAGNOSIS — Z13220 Encounter for screening for lipoid disorders: Secondary | ICD-10-CM | POA: Diagnosis not present

## 2020-11-25 DIAGNOSIS — Z1329 Encounter for screening for other suspected endocrine disorder: Secondary | ICD-10-CM

## 2020-11-25 DIAGNOSIS — E559 Vitamin D deficiency, unspecified: Secondary | ICD-10-CM | POA: Diagnosis not present

## 2020-11-25 DIAGNOSIS — Z0000 Encounter for general adult medical examination without abnormal findings: Secondary | ICD-10-CM

## 2020-11-25 DIAGNOSIS — J0110 Acute frontal sinusitis, unspecified: Secondary | ICD-10-CM

## 2020-11-25 DIAGNOSIS — Z789 Other specified health status: Secondary | ICD-10-CM

## 2020-11-25 DIAGNOSIS — Z1159 Encounter for screening for other viral diseases: Secondary | ICD-10-CM

## 2020-11-25 DIAGNOSIS — F419 Anxiety disorder, unspecified: Secondary | ICD-10-CM

## 2020-11-25 LAB — CBC
HCT: 39.4 % (ref 36.0–46.0)
Hemoglobin: 13.3 g/dL (ref 12.0–15.0)
MCHC: 33.7 g/dL (ref 30.0–36.0)
MCV: 92.9 fl (ref 78.0–100.0)
Platelets: 250 10*3/uL (ref 150.0–400.0)
RBC: 4.24 Mil/uL (ref 3.87–5.11)
RDW: 11.9 % (ref 11.5–15.5)
WBC: 9.8 10*3/uL (ref 4.0–10.5)

## 2020-11-25 LAB — LIPID PANEL
Cholesterol: 185 mg/dL (ref 0–200)
HDL: 76.7 mg/dL (ref >39.00)
LDL Cholesterol: 88 mg/dL (ref 0–99)
NonHDL: 108.4
Total CHOL/HDL Ratio: 2
Triglycerides: 100 mg/dL (ref 0.0–149.0)
VLDL: 20 mg/dL (ref 0.0–40.0)

## 2020-11-25 LAB — COMPREHENSIVE METABOLIC PANEL
ALT: 17 U/L (ref 0–35)
AST: 19 U/L (ref 0–37)
Albumin: 3.9 g/dL (ref 3.5–5.2)
Alkaline Phosphatase: 53 U/L (ref 39–117)
BUN: 16 mg/dL (ref 6–23)
CO2: 30 mEq/L (ref 19–32)
Calcium: 9.2 mg/dL (ref 8.4–10.5)
Chloride: 102 mEq/L (ref 96–112)
Creatinine, Ser: 0.84 mg/dL (ref 0.40–1.20)
GFR: 85.07 mL/min (ref >60.00)
Glucose, Bld: 83 mg/dL (ref 70–99)
Potassium: 4 mEq/L (ref 3.5–5.1)
Sodium: 139 mEq/L (ref 135–145)
Total Bilirubin: 0.4 mg/dL (ref 0.2–1.2)
Total Protein: 6.6 g/dL (ref 6.0–8.3)

## 2020-11-25 LAB — TSH: TSH: 1.65 u[IU]/mL (ref 0.35–4.50)

## 2020-11-25 LAB — VITAMIN D 25 HYDROXY (VIT D DEFICIENCY, FRACTURES): VITD: 43.97 ng/mL (ref 30.00–100.00)

## 2020-11-25 LAB — HEMOGLOBIN A1C: Hgb A1c MFr Bld: 5.6 % (ref 4.6–6.5)

## 2020-11-25 MED ORDER — SERTRALINE HCL 100 MG PO TABS: ORAL_TABLET | ORAL | 4 refills | Status: DC

## 2020-11-25 MED ORDER — AMOXICILLIN 500 MG PO CAPS: 1000.0000 mg | ORAL_CAPSULE | Freq: Two times a day (BID) | ORAL | 0 refills | Status: DC

## 2020-11-25 NOTE — Patient Instructions (Signed)
Good to see you again today!  We will treat you for presumed sinus infection with amoxicillin for 10 days. Let me know if not getting better soon!    I will be in touch with your labs Please consider getting your covid vaccines- I do think this is a smart idea  Health Maintenance, Female Adopting a healthy lifestyle and getting preventive care are important in promoting health and wellness. Ask your health care provider about:  The right schedule for you to have regular tests and exams.  Things you can do on your own to prevent diseases and keep yourself healthy. What should I know about diet, weight, and exercise? Eat a healthy diet  Eat a diet that includes plenty of vegetables, fruits, low-fat dairy products, and lean protein.  Do not eat a lot of foods that are high in solid fats, added sugars, or sodium.   Maintain a healthy weight Body mass index (BMI) is used to identify weight problems. It estimates body fat based on height and weight. Your health care provider can help determine your BMI and help you achieve or maintain a healthy weight. Get regular exercise Get regular exercise. This is one of the most important things you can do for your health. Most adults should:  Exercise for at least 150 minutes each week. The exercise should increase your heart rate and make you sweat (moderate-intensity exercise).  Do strengthening exercises at least twice a week. This is in addition to the moderate-intensity exercise.  Spend less time sitting. Even light physical activity can be beneficial. Watch cholesterol and blood lipids Have your blood tested for lipids and cholesterol at 44 years of age, then have this test every 5 years. Have your cholesterol levels checked more often if:  Your lipid or cholesterol levels are high.  You are older than 44 years of age.  You are at high risk for heart disease. What should I know about cancer screening? Depending on your health history and  family history, you may need to have cancer screening at various ages. This may include screening for:  Breast cancer.  Cervical cancer.  Colorectal cancer.  Skin cancer.  Lung cancer. What should I know about heart disease, diabetes, and high blood pressure? Blood pressure and heart disease  High blood pressure causes heart disease and increases the risk of stroke. This is more likely to develop in people who have high blood pressure readings, are of African descent, or are overweight.  Have your blood pressure checked: ? Every 3-5 years if you are 96-46 years of age. ? Every year if you are 1 years old or older. Diabetes Have regular diabetes screenings. This checks your fasting blood sugar level. Have the screening done:  Once every three years after age 60 if you are at a normal weight and have a low risk for diabetes.  More often and at a younger age if you are overweight or have a high risk for diabetes. What should I know about preventing infection? Hepatitis B If you have a higher risk for hepatitis B, you should be screened for this virus. Talk with your health care provider to find out if you are at risk for hepatitis B infection. Hepatitis C Testing is recommended for:  Everyone born from [birth year] through 1965.  Anyone with known risk factors for hepatitis C. Sexually transmitted infections (STIs)  Get screened for STIs, including gonorrhea and chlamydia, if: ? You are sexually active and are younger than 44 years  of age. ? You are older than 44 years of age and your health care provider tells you that you are at risk for this type of infection. ? Your sexual activity has changed since you were last screened, and you are at increased risk for chlamydia or gonorrhea. Ask your health care provider if you are at risk.  Ask your health care provider about whether you are at high risk for HIV. Your health care provider may recommend a prescription medicine to help prevent  HIV infection. If you choose to take medicine to prevent HIV, you should first get tested for HIV. You should then be tested every 3 months for as long as you are taking the medicine. Pregnancy  If you are about to stop having your period (premenopausal) and you may become pregnant, seek counseling before you get pregnant.  Take 400 to 800 micrograms (mcg) of folic acid every day if you become pregnant.  Ask for birth control (contraception) if you want to prevent pregnancy. Osteoporosis and menopause Osteoporosis is a disease in which the bones lose minerals and strength with aging. This can result in bone fractures. If you are 3 years old or older, or if you are at risk for osteoporosis and fractures, ask your health care provider if you should:  Be screened for bone loss.  Take a calcium or vitamin D supplement to lower your risk of fractures.  Be given hormone replacement therapy (HRT) to treat symptoms of menopause. Follow these instructions at home: Lifestyle  Do not use any products that contain nicotine or tobacco, such as cigarettes, e-cigarettes, and chewing tobacco. If you need help quitting, ask your health care provider.  Do not use street drugs.  Do not share needles.  Ask your health care provider for help if you need support or information about quitting drugs. Alcohol use  Do not drink alcohol if: ? Your health care provider tells you not to drink. ? You are pregnant, may be pregnant, or are planning to become pregnant.  If you drink alcohol: ? Limit how much you use to 0-1 drink a day. ? Limit intake if you are breastfeeding.  Be aware of how much alcohol is in your drink. In the [place] one drink equals one 12 oz bottle of beer (355 mL), one 5 oz glass of wine (148 mL), or one 1 oz glass of hard liquor (44 mL). General instructions  Schedule regular health, dental, and eye exams.  Stay current with your vaccines.  Tell your health care provider if: ? You  often feel depressed. ? You have ever been abused or do not feel safe at home. Summary  Adopting a healthy lifestyle and getting preventive care are important in promoting health and wellness.  Follow your health care provider's instructions about healthy diet, exercising, and getting tested or screened for diseases.  Follow your health care provider's instructions on monitoring your cholesterol and blood pressure. This information is not intended to replace advice given to you by your health care provider. Make sure you discuss any questions you have with your health care provider. Document Revised: 07/31/2018 Document Reviewed: 07/31/2018 Elsevier Patient Education  2021 [employer].

## 2020-12-16 ENCOUNTER — Other Ambulatory Visit: Payer: Self-pay

## 2020-12-16 ENCOUNTER — Ambulatory Visit
Admission: RE | Admit: 2020-12-16 | Discharge: 2020-12-16 | Disposition: A | Discharge status: Home / Self Care | Payer: Commercial Managed Care - PPO | Source: Ambulatory Visit | Attending: Family Medicine | Admitting: Family Medicine

## 2020-12-16 DIAGNOSIS — Z1231 Encounter for screening mammogram for malignant neoplasm of breast: Secondary | ICD-10-CM

## 2021-08-02 ENCOUNTER — Other Ambulatory Visit: Payer: Self-pay

## 2021-08-02 NOTE — Telephone Encounter (Signed)
Paper Rx refill request sent for Viorelle Tabs. Faxed back request/denial due to patient being over due for annual exam and nothing currently scheduled.

## 2021-08-02 NOTE — Telephone Encounter (Signed)
Last AEX 07/26/20. Scheduled 08/17/21.

## 2021-08-03 MED ORDER — DESOGESTREL-ETHINYL ESTRADIOL 0.15-0.02/0.01 MG (21/5) PO TABS: 1.0000 | ORAL_TABLET | Freq: Every day | ORAL | 0 refills | Status: DC

## 2021-08-17 ENCOUNTER — Other Ambulatory Visit: Payer: Self-pay

## 2021-08-17 ENCOUNTER — Ambulatory Visit (INDEPENDENT_AMBULATORY_CARE_PROVIDER_SITE_OTHER): Payer: Commercial Managed Care - PPO | Admitting: Nurse Practitioner

## 2021-08-17 ENCOUNTER — Encounter: Payer: Self-pay | Admitting: Nurse Practitioner

## 2021-08-17 VITALS — BP 112/68 | HR 53 | Ht 64.5 in | Wt 132.0 lb

## 2021-08-17 DIAGNOSIS — Z01419 Encounter for gynecological examination (general) (routine) without abnormal findings: Secondary | ICD-10-CM | POA: Diagnosis not present

## 2021-08-17 DIAGNOSIS — F32A Depression, unspecified: Secondary | ICD-10-CM | POA: Diagnosis not present

## 2021-08-17 DIAGNOSIS — F419 Anxiety disorder, unspecified: Secondary | ICD-10-CM | POA: Diagnosis not present

## 2021-08-17 DIAGNOSIS — Z3041 Encounter for surveillance of contraceptive pills: Secondary | ICD-10-CM

## 2021-08-17 MED ORDER — DESOGESTREL-ETHINYL ESTRADIOL 0.15-0.02/0.01 MG (21/5) PO TABS: 1.0000 | ORAL_TABLET | Freq: Every day | ORAL | 3 refills | Status: DC

## 2021-08-17 MED ORDER — SERTRALINE HCL 50 MG PO TABS: 50.0000 mg | ORAL_TABLET | Freq: Every day | ORAL | 3 refills | Status: DC

## 2021-08-17 NOTE — Progress Notes (Signed)
Kelly Stanley [birth year] [id number]   History:  45 y.o. [id number] presents for annual exam. Did not have cycle last month but cycle started at regular time this month. Denies menopausal symptoms. On OCPs. LGSIL 2005-2007, negative colposcopy at that time, normal pap smears since then. Anxiety managed well with Zoloft (currently taking 1/2 of 100 mg tablet daily).   Gynecologic History Patient's last menstrual period was 08/07/2021 (exact date). Period Cycle (Days): 28 Period Duration (Days): 7 Period Pattern: Regular Menstrual Flow:  (varies) Menstrual Control: Tampon Dysmenorrhea: (!) Mild Dysmenorrhea Symptoms: Cramping, Headache, Diarrhea Contraception/Family planning: OCP (estrogen/progesterone) Sexually active: Yes  Health Maintenance Last Pap: 06/04/2018. Results were: Normal, 5-year repeat Last mammogram: 12/16/2020. Results were: Normal Last colonoscopy: 2010. Results were: Normal Last Dexa: Not indicated  Past medical history, past surgical history, family history and social history were all reviewed and documented in the EPIC chart. Married. 4 yo son, 53 yo daughter. 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 Husband is also Pharmacist, hospital. Mother with history of ovarian cancer, negative genetic testing.   ROS:  A ROS was performed and pertinent positives and negatives are included.  Exam:  Vitals:   08/17/21 0952  BP: 112/68  Pulse: (!) 53  SpO2: 98%  Weight: 132 lb (59.9 kg)  Height: 5' 4.5" (1.638 m)   Body mass index is 22.31 kg/m.  General appearance:  Normal Thyroid:  Symmetrical, normal in size, without palpable masses or nodularity. Respiratory  Auscultation:  Clear without wheezing or rhonchi Cardiovascular  Auscultation:  Regular rate, without rubs, murmurs or gallops  Edema/varicosities:  Not grossly evident Abdominal  Soft,nontender, without masses, guarding or rebound.  Liver/spleen:  No organomegaly noted  Hernia:  None appreciated  Skin  Inspection:  Grossly  normal Breasts: Examined lying and sitting.   Right: Without masses, retractions, nipple discharge or axillary adenopathy.   Left: Without masses, retractions, nipple discharge or axillary adenopathy. Genitourinary   Inguinal/mons:  Normal without inguinal adenopathy  External genitalia:  Normal appearing vulva with no masses, tenderness, or lesions  BUS/Urethra/Skene's glands:  Normal  Vagina:  Normal appearing with normal color and discharge, no lesions  Cervix:  Normal appearing without discharge or lesions  Uterus:  Normal in size, shape and contour.  Midline and mobile, nontender  Adnexa/parametria:     Rt: Normal in size, without masses or tenderness.   Lt: Normal in size, without masses or tenderness.  Anus and perineum: Normal  Digital rectal exam: Normal sphincter tone without palpated masses or tenderness  Patient informed chaperone available to be present for breast and pelvic exam. Patient has requested no chaperone to be present. Patient has been advised what will be completed during breast and pelvic exam.   Assessment/Plan:  45 y.o. [id number] for annual exam.   Well female exam with routine gynecological exam - Education provided on SBEs, importance of preventative screenings, current guidelines, high calcium diet, regular exercise, and multivitamin daily. Labs with PCP.   Encounter for surveillance of contraceptive pills - Plan: desogestrel-ethinyl estradiol (VIORELE) 0.15-0.02/0.01 MG (21/5) tablet daily. Taking as prescribed. Refill x 1 year provided. Did not have cycle last month but this month came at normal time. Discussed perimenopause and changes that can occur in her 44s.   Anxiety and depression - Plan: sertraline (ZOLOFT) 50 MG tablet daily. Doing well on this and wants to continue. Refill x 1 year provided.   Screening for cervical cancer -  LGSIL 2005-2007, negative colposcopy at that time, normal pap smears since then.  Will repeat at 5-year interval per  guidelines.  Screening for breast cancer - Normal mammogram history.  Continue annual screenings.  Normal breast exam today.  Screening for colon cancer - 2010 colonoscopy. Discussed current guidelines and will plan colonoscopy next year at age 45.   Return in 1 year for annual.   Tamela Gammon DNP, 10:30 AM 08/17/2021

## 2021-10-24 ENCOUNTER — Other Ambulatory Visit: Payer: Self-pay | Admitting: Nurse Practitioner

## 2021-10-24 DIAGNOSIS — Z3041 Encounter for surveillance of contraceptive pills: Secondary | ICD-10-CM

## 2021-10-24 NOTE — Telephone Encounter (Signed)
Rx refill request received for Viorele.  ? ?Rx sent 08/17/21, #[id number]. Should have refills on file.  ? ?Call placed to [facility], spoke with Claiborne Billings, confirmed refills on file, no additional Rx needed.  ? ?Rx refused.  ? ?Encounter closed.  ?

## 2021-11-04 ENCOUNTER — Other Ambulatory Visit: Payer: Self-pay | Admitting: Family Medicine

## 2021-11-04 DIAGNOSIS — Z1231 Encounter for screening mammogram for malignant neoplasm of breast: Secondary | ICD-10-CM

## 2021-12-04 ENCOUNTER — Other Ambulatory Visit: Payer: Self-pay | Admitting: Family Medicine

## 2021-12-04 DIAGNOSIS — F419 Anxiety disorder, unspecified: Secondary | ICD-10-CM

## 2021-12-12 ENCOUNTER — Emergency Department (HOSPITAL_BASED_OUTPATIENT_CLINIC_OR_DEPARTMENT_OTHER): Payer: Commercial Managed Care - PPO

## 2021-12-12 ENCOUNTER — Other Ambulatory Visit: Payer: Self-pay

## 2021-12-12 ENCOUNTER — Emergency Department (HOSPITAL_BASED_OUTPATIENT_CLINIC_OR_DEPARTMENT_OTHER)
Admission: EM | Admit: 2021-12-12 | Discharge: 2021-12-12 | Disposition: A | Discharge status: Home / Self Care | Payer: Commercial Managed Care - PPO | Attending: Emergency Medicine | Admitting: Emergency Medicine

## 2021-12-12 ENCOUNTER — Encounter (HOSPITAL_BASED_OUTPATIENT_CLINIC_OR_DEPARTMENT_OTHER): Payer: Self-pay | Admitting: *Deleted

## 2021-12-12 ENCOUNTER — Telehealth: Payer: Self-pay

## 2021-12-12 DIAGNOSIS — X58XXXA Exposure to other specified factors, initial encounter: Secondary | ICD-10-CM | POA: Insufficient documentation

## 2021-12-12 DIAGNOSIS — R55 Syncope and collapse: Secondary | ICD-10-CM | POA: Insufficient documentation

## 2021-12-12 DIAGNOSIS — R4182 Altered mental status, unspecified: Secondary | ICD-10-CM | POA: Insufficient documentation

## 2021-12-12 DIAGNOSIS — R001 Bradycardia, unspecified: Secondary | ICD-10-CM | POA: Diagnosis not present

## 2021-12-12 DIAGNOSIS — S8991XA Unspecified injury of right lower leg, initial encounter: Secondary | ICD-10-CM | POA: Diagnosis present

## 2021-12-12 DIAGNOSIS — S83411A Sprain of medial collateral ligament of right knee, initial encounter: Secondary | ICD-10-CM | POA: Diagnosis not present

## 2021-12-12 LAB — URINALYSIS, ROUTINE W REFLEX MICROSCOPIC
Bilirubin Urine: NEGATIVE
Glucose, UA: NEGATIVE mg/dL
Hgb urine dipstick: NEGATIVE
Ketones, ur: NEGATIVE mg/dL
Leukocytes,Ua: NEGATIVE
Nitrite: NEGATIVE
Protein, ur: NEGATIVE mg/dL
Specific Gravity, Urine: 1.01 (ref 1.005–1.030)
pH: 6.5 (ref 5.0–8.0)

## 2021-12-12 LAB — CBC WITH DIFFERENTIAL/PLATELET
Abs Immature Granulocytes: 0.03 10*3/uL (ref 0.00–0.07)
Basophils Absolute: 0.1 10*3/uL (ref 0.0–0.1)
Basophils Relative: 1 %
Eosinophils Absolute: 0.1 10*3/uL (ref 0.0–0.5)
Eosinophils Relative: 1 %
HCT: 38.2 % (ref 36.0–46.0)
Hemoglobin: 13.3 g/dL (ref 12.0–15.0)
Immature Granulocytes: 0 %
Lymphocytes Relative: 19 %
Lymphs Abs: 1.7 10*3/uL (ref 0.7–4.0)
MCH: 31.8 pg (ref 26.0–34.0)
MCHC: 34.8 g/dL (ref 30.0–36.0)
MCV: 91.4 fL (ref 80.0–100.0)
Monocytes Absolute: 0.5 10*3/uL (ref 0.1–1.0)
Monocytes Relative: 6 %
Neutro Abs: 6.5 10*3/uL (ref 1.7–7.7)
Neutrophils Relative %: 73 %
Platelets: 186 10*3/uL (ref 150–400)
RBC: 4.18 MIL/uL (ref 3.87–5.11)
RDW: 11.8 % (ref 11.5–15.5)
WBC: 8.8 10*3/uL (ref 4.0–10.5)
nRBC: 0 % (ref 0.0–0.2)

## 2021-12-12 LAB — COMPREHENSIVE METABOLIC PANEL
ALT: 20 U/L (ref 0–44)
AST: 23 U/L (ref 15–41)
Albumin: 3.5 g/dL (ref 3.5–5.0)
Alkaline Phosphatase: 36 U/L — ABNORMAL LOW (ref 38–126)
Anion gap: 7 (ref 5–15)
BUN: 16 mg/dL (ref 6–20)
CO2: 25 mmol/L (ref 22–32)
Calcium: 8.7 mg/dL — ABNORMAL LOW (ref 8.9–10.3)
Chloride: 102 mmol/L (ref 98–111)
Creatinine, Ser: 0.86 mg/dL (ref 0.44–1.00)
GFR, Estimated: >60 mL/min (ref >60)
Glucose, Bld: 95 mg/dL (ref 70–99)
Potassium: 3.6 mmol/L (ref 3.5–5.1)
Sodium: 134 mmol/L — ABNORMAL LOW (ref 135–145)
Total Bilirubin: 0.5 mg/dL (ref 0.3–1.2)
Total Protein: 6.6 g/dL (ref 6.5–8.1)

## 2021-12-12 LAB — CBG MONITORING, ED: Glucose-Capillary: 107 mg/dL — ABNORMAL HIGH (ref 70–99)

## 2021-12-12 LAB — PREGNANCY, URINE: Preg Test, Ur: NEGATIVE

## 2021-12-12 LAB — HCG, SERUM, QUALITATIVE: Preg, Serum: NEGATIVE

## 2021-12-12 MED ORDER — SODIUM CHLORIDE 0.9 % IV BOLUS
1000.0000 mL | Freq: Once | INTRAVENOUS | Status: AC
  Administered 2021-12-12: 1000 mL via INTRAVENOUS

## 2021-12-12 NOTE — Telephone Encounter (Signed)
Called husband Elijah back -they are here in the ER right now  ?

## 2021-12-12 NOTE — ED Notes (Signed)
ED Provider at bedside. 

## 2021-12-12 NOTE — ED Triage Notes (Addendum)
Client was in restroom, was having pain from knee injury sustained last PM. Husband states she seemed confused, speech was garbled, pale looking and husband states she then had a syncopal episode, after awaking was still confused. EMS was called. Has taken a Xanax this am. Took Tylenol for knee pain. Has missed some BC meds. Poor appetite, has noted she is having rectal bleeding as well.  ?

## 2021-12-12 NOTE — ED Provider Notes (Signed)
?[facility] ?Provider Note ? ? ?CSN: [id number] ?Arrival date & time: 12/12/21  0847 ? ?  ? ?History ? ?Chief Complaint  ?Patient presents with  ? Altered Mental Status  ? ? ?Kelly Stanley is a 46 y.o. female. ? ?46 year old female with past medical history of anxiety presents with concern for brief episode of altered mental status and syncopal episode today.  Patient reports having significant diarrhea last week (4/19-4/20) however seemed to make a full recovery and played in a soccer game yesterday.  Patient reports hyperextending her knee and injuring her right knee, and has been treating at home with rest, elastic sleeve and Motrin.  Patient woke up this morning around 5:30 AM to get up for Bible study, she recalls going to the bathroom and the next thing she remembers is feeling very hot, sweaty, feeling like she "could not get out" and passed out.  Patient's husband states that at this time, he heard her in the bathroom, asked if she needed any assistance and when she came out of the bathroom she looked confused and said help and passed out briefly.  EMS was called, assessed patient on scene and recommended evaluation.  Patient called their PCP office who advised she should come to the ER.  Patient did take 1 Xanax which she has on hand for anxiety and states that she does not take this regularly, has not taken this in several years.  She arrives in the ER feeling overall improved. ?Patient states with her diarrheal illness last week, she did accidentally skip her Zoloft and birth control pills, resumed her Zoloft and was taking double birth control pills for the first few days, unsure if this has anything to do with her episode today. ?Patient is generally healthy, works as a Pharmacist, hospital, plays soccer and exercises regularly. Denies drug use.  ? ? ?  ? ?Home Medications ?Prior to Admission medications   ?Medication Sig Start Date End Date Taking? Authorizing Provider   ?desogestrel-ethinyl estradiol (VIORELE) 0.15-0.02/0.01 MG (21/5) tablet Take 1 tablet by mouth daily. 08/17/21   Tamela Gammon, NP  ?sertraline (ZOLOFT) 50 MG tablet Take 1 tablet (50 mg total) by mouth daily. 08/17/21   Tamela Gammon, NP  ?   ? ?Allergies    ?Codeine   ? ?Review of Systems   ?Review of Systems ?Negative except as per HPI ?Physical Exam ?Updated Vital Signs ?BP 103/75   Pulse (!) 53   Temp 97.6 ?F (36.4 ?C) (Oral)   Resp 16   Ht '5\' 4"'$  (1.626 m)   Wt 59 kg   LMP 11/28/2021   SpO2 100%   BMI 22.31 kg/m?  ?Physical Exam ?Vitals and nursing note reviewed.  ?Constitutional:   ?   General: She is not in acute distress. ?   Appearance: She is well-developed. She is not diaphoretic.  ?HENT:  ?   Head: Normocephalic and atraumatic.  ?   Mouth/Throat:  ?   Mouth: Mucous membranes are moist.  ?Eyes:  ?   Extraocular Movements: Extraocular movements intact.  ?   Conjunctiva/sclera: Conjunctivae normal.  ?   Pupils: Pupils are equal, round, and reactive to light.  ?Cardiovascular:  ?   Rate and Rhythm: Regular rhythm. Bradycardia present.  ?   Pulses: Normal pulses.  ?   Heart sounds: Normal heart sounds.  ?Pulmonary:  ?   Effort: Pulmonary effort is normal.  ?   Breath sounds: Normal breath sounds.  ?Abdominal:  ?  Palpations: Abdomen is soft.  ?Musculoskeletal:     ?   General: Tenderness present. No swelling.  ?   Right knee: No swelling, effusion, erythema, ecchymosis or bony tenderness. Decreased range of motion. Tenderness present over the medial joint line and MCL. Normal patellar mobility. Normal pulse.  ?   Right lower leg: No edema.  ?   Left lower leg: No edema.  ?Skin: ?   General: Skin is warm and dry.  ?   Findings: No bruising, erythema or rash.  ?Neurological:  ?   Mental Status: She is alert and oriented to person, place, and time.  ?   Cranial Nerves: No cranial nerve deficit.  ?   Sensory: No sensory deficit.  ?   Motor: No weakness.  ?   Coordination: Coordination normal.   ?Psychiatric:     ?   Behavior: Behavior normal.  ? ? ?ED Results / Procedures / Treatments   ?Labs ?(all labs ordered are listed, but only abnormal results are displayed) ?Labs Reviewed  ?COMPREHENSIVE METABOLIC PANEL - Abnormal; Notable for the following components:  ?    Result Value  ? Sodium 134 (*)   ? Calcium 8.7 (*)   ? Alkaline Phosphatase 36 (*)   ? All other components within normal limits  ?CBG MONITORING, ED - Abnormal; Notable for the following components:  ? Glucose-Capillary 107 (*)   ? All other components within normal limits  ?CBC WITH DIFFERENTIAL/PLATELET  ?URINALYSIS, ROUTINE W REFLEX MICROSCOPIC  ?HCG, SERUM, QUALITATIVE  ?PREGNANCY, URINE  ? ? ?EKG ?None ? ?Radiology ?CT Head Wo Contrast ? ?Result Date: 12/12/2021 ?CLINICAL DATA:  Mental status change EXAM: CT HEAD WITHOUT CONTRAST TECHNIQUE: Contiguous axial images were obtained from the base of the skull through the vertex without intravenous contrast. RADIATION DOSE REDUCTION: This exam was performed according to the departmental dose-optimization program which includes automated exposure control, adjustment of the mA and/or kV according to patient size and/or use of iterative reconstruction technique. COMPARISON:  None. FINDINGS: Brain: No evidence of acute infarction, hemorrhage, hydrocephalus, extra-axial collection or mass lesion/mass effect. Vascular: No hyperdense vessel or unexpected calcification. Skull: Normal. Negative for fracture or focal lesion. Sinuses/Orbits: No acute finding. Other: None. IMPRESSION: No acute intracranial abnormality. Electronically Signed   By: Yetta Glassman M.D.   On: 12/12/2021 09:55  ? ?DG Knee Complete 4 Views Right ? ?Result Date: 12/12/2021 ?CLINICAL DATA:  Right knee pain along medial side of patella with swelling status post possible hyperextension during soccer game EXAM: RIGHT KNEE - COMPLETE 4+ VIEW COMPARISON:  None. FINDINGS: There is no acute fracture or dislocation. Knee alignment is  normal. The joint spaces are preserved. The soft tissues are unremarkable. There is no effusion. IMPRESSION: Negative. Electronically Signed   By: Valetta Mole M.D.   On: 12/12/2021 10:52   ? ?Procedures ?Procedures  ? ? ?Medications Ordered in ED ?Medications  ?sodium chloride 0.9 % bolus 1,000 mL (0 mLs Intravenous Stopped 12/12/21 1125)  ? ? ?ED Course/ Medical Decision Making/ A&P ?  ?                        ?Medical Decision Making ?Amount and/or Complexity of Data Reviewed ?Labs: ordered. ?Radiology: ordered. ?ECG/medicine tests: ordered. ? ? ?This patient presents to the ED for concern of syncopal episode with right knee pain as above, this involves an extensive number of treatment options, and is a complaint that carries with it a high  risk of complications and morbidity.  The differential diagnosis includes arrhythmia, dehydration, electrolyte disturbance,  ? ? ?Co morbidities that complicate the patient evaluation ? ?Anxiety ? ? ?Additional history obtained: ? ?Additional history obtained from husband at bed side as above ?External records from outside source obtained and reviewed including prior labs from 11/25/2020, hemoglobin A1c is normal, TSH normal ? ? ?Lab Tests: ? ?I Ordered, and personally interpreted labs.  The pertinent results include: hCG negative, urinalysis normal.  CBC within normal limits, CMP without significant electrolyte derangement, normal hepatic and renal function. ? ? ?Imaging Studies ordered: ? ?I ordered imaging studies including CT head, x-ray right knee ?I independently visualized and interpreted imaging which showed no midline shift, no obvious abnormality of the brain.  X-ray right knee without acute injury, no evidence of effusion ?I agree with the radiologist interpretation ? ? ?Cardiac Monitoring: / EKG: ? ?The patient was maintained on a cardiac monitor.  I personally viewed and interpreted the cardiac monitored which showed an underlying rhythm of: Sinus rhythm ? ? ?Problem  List / ED Course / Critical interventions / Medication management ? ?46 year old female presents from home with concern for syncopal episode which occurred earlier today as above.  On exam, found to have slight discomfort t

## 2021-12-12 NOTE — Telephone Encounter (Signed)
PatientName:Kelly Stanley ?Gender: Female ?DOB: [birth year] ?Age: 46 Y 42 M 15 D ?[id number] ?Provider Copland, Janett Billow- MD ? ?Caller Name [facility] ?Relationship To Patient Spouse ?Chief Complaint FAINTING or PASSING OUT ?states his wife passed out. She states she is ?confused an babbling. ? ?Confirm and document reason for call. If ?symptomatic, describe symptoms. ?---. Caller states his wife passed out. She states she was ?confused an babbling. Caller states patient was havingchest pain, sweating. Caller states they called 911. ?EKG was done. BP was low and improved with EMS there. EMS encouraged to follow up with pcp. Caller ?states patient had a knee injury yesterday and doesn't ?do well with pain. Knee hasn't bee evaluated yet. ?Caller missed birth control pills for the past several ?days and has doubled up on birth control. Caller states ?patient is currently sleeping after taking a xanax. ? ?

## 2021-12-12 NOTE — Discharge Instructions (Signed)
Home to rest today.  Call your primary care provider to arrange follow-up in the next week.  Return to the emergency room for return of symptoms or any other concerning symptoms.  Follow-up with your orthopedist regarding your right knee injury. ?

## 2021-12-23 ENCOUNTER — Ambulatory Visit
Admission: RE | Admit: 2021-12-23 | Discharge: 2021-12-23 | Disposition: A | Discharge status: Home / Self Care | Payer: Commercial Managed Care - PPO | Source: Ambulatory Visit | Attending: Family Medicine | Admitting: Family Medicine

## 2021-12-23 DIAGNOSIS — Z1231 Encounter for screening mammogram for malignant neoplasm of breast: Secondary | ICD-10-CM

## 2021-12-26 ENCOUNTER — Encounter: Payer: Commercial Managed Care - PPO | Admitting: Family Medicine

## 2022-01-01 NOTE — Progress Notes (Deleted)
[place] at [facility] [address] [place], [place] [address] [phone]  Date:  01/04/2022   Name:  Kelly Stanley   DOB:  Jan 28, 1976   MRN:  [id number]  PCP:  Darreld Mclean, MD    Chief Complaint: No chief complaint on file.   History of Present Illness:  Kelly Stanley is a 46 y.o. 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 Married, she enjoys soccer and running  Mammogram done earlier this month Colon cancer screening Full labs done 1 year ago-last month the ER did a CMP, CBC with differential  Patient Active Problem List   Diagnosis Date Noted   Post concussion syndrome 10/02/2014   Allergic contact dermatitis 01/26/2014   Anxiety and depression 11/17/2013   Chronic night sweats 11/17/2013   Family history of ovarian cancer 11/17/2013   Allergic rhinitis 11/17/2013   Orthostatic hypotension 11/17/2013    Past Medical History:  Diagnosis Date   Anxiety    Syncope     Past Surgical History:  Procedure Laterality Date   APPENDECTOMY     KNEE ARTHROSCOPY W/ LATERAL RELEASE Left 1997    Social History   Tobacco Use   Smoking status: Never   Smokeless tobacco: Never  Vaping Use   Vaping Use: Never used  Substance Use Topics   Alcohol use: Yes    Alcohol/week: 0.0 standard drinks    Comment: Occas   Drug use: No    Family History  Problem Relation Age of Onset   Ovarian cancer Mother    Breast cancer Paternal Aunt 79   Ovarian cancer Maternal Grandmother    Stroke  Maternal Grandfather    Diabetes Maternal Grandfather    Breast cancer Cousin 31       paternal 1st cousin    Allergies  Allergen Reactions   Codeine Nausea And Vomiting    Medication list has been reviewed and updated.  Current Outpatient Medications on File Prior to Visit  Medication Sig Dispense Refill   desogestrel-ethinyl estradiol (VIORELE) 0.15-0.02/0.01 MG (21/5) tablet Take 1 tablet by mouth daily. 84 tablet 3   sertraline (ZOLOFT) 50 MG tablet Take 1 tablet (50 mg total) by mouth daily. 90 tablet 3   No current facility-administered medications on file prior to visit.    Review of Systems:  As per HPI- otherwise negative.   Physical Examination: There were no vitals filed for this visit. There were no vitals filed for this visit. There is no height or weight on file to calculate BMI. Ideal Body Weight:    GEN: no acute distress. HEENT: Atraumatic, Normocephalic.  Ears and Nose: No external deformity. CV: RRR, No M/G/R. No JVD. No thrill. No extra heart sounds. PULM: CTA B, no wheezes, crackles, rhonchi. No retractions. No resp. distress. No accessory muscle use. ABD: S, NT, ND, +BS. No rebound. No HSM. EXTR: No c/c/e PSYCH: Normally interactive. Conversant.    Assessment and Plan: ***  Signed Lamar Blinks, MD

## 2022-01-01 NOTE — Patient Instructions (Signed)
It was good to see you today, I will be in touch with your labs as soon as possible ? ?We can order colon cancer screening of your choice after your upcoming birthday ? ?

## 2022-01-02 ENCOUNTER — Telehealth: Payer: Self-pay | Admitting: *Deleted

## 2022-01-02 NOTE — Telephone Encounter (Signed)
Caller Name Naydelin Ziegler ?Caller Phone Number [phone] ?Patient Name Kelly Stanley ?Patient DOB [birth year] ?Call Type Message Only Information Provided ?Reason for Call Request to Reschedule Office Appointment ?Initial Comment Caller states she has an appt on Wednesday and needs to reschedule. ?Patient request to speak to RN No ?Additional Comment Caller declined triage. Office hours provided and advised to follow up with the office. ?Disp. Time Disposition Final User ?01/02/2022 7:29:59 AM General Information Provided Yes Gaspar Skeeters ?Call Closed By: Gaspar Skeeters ?Transaction Date/Time: 01/02/2022 7:27:41 AM (ET ?

## 2022-01-02 NOTE — Telephone Encounter (Signed)
Patient rescheduled

## 2022-01-04 ENCOUNTER — Ambulatory Visit (INDEPENDENT_AMBULATORY_CARE_PROVIDER_SITE_OTHER): Payer: Commercial Managed Care - PPO | Admitting: Family Medicine

## 2022-01-04 DIAGNOSIS — Z538 Procedure and treatment not carried out for other reasons: Secondary | ICD-10-CM

## 2022-01-05 NOTE — Progress Notes (Signed)
Canceled.

## 2022-01-16 NOTE — Patient Instructions (Signed)
It was great to see you again today, I will be in touch with your labs

## 2022-01-16 NOTE — Progress Notes (Unsigned)
[place] at [facility] [address], [place], [place] [address] [phone]  Date:  01/18/2022   Name:  Kelly Stanley   DOB:  Jul 20, 1976   MRN:  [id number]  PCP:  Darreld Mclean, MD    Chief Complaint: No chief complaint on file.   History of Present Illness:  Kelly Stanley is a 46 y.o. very pleasant female patient who presents with the following:  Patient seen today for physical exam-generally in good health Most recent visit with myself was about 1 year ago In the meantime she was seen in the ER last month following a syncopal episode-Per notes, it appears she had a vagal versus hypotensive episode  She was seen by her gynecology provider in December  Lab work was done at her ER visit including a CMP, CBC-however calcium was slightly low Can repeat calcium and check additional labs today ER notes also mention some blood in stool, likely due to recent diarrhea.  46th birthday is approaching, discussed colon cancer screening-  Mammogram is up-to-date  Taking sertraline, OCP Patient Active Problem List   Diagnosis Date Noted   Post concussion syndrome 10/02/2014   Allergic contact dermatitis 01/26/2014   Anxiety and depression 11/17/2013   Chronic night sweats 11/17/2013   Family history of ovarian cancer 11/17/2013   Allergic rhinitis 11/17/2013   Orthostatic hypotension 11/17/2013    Past Medical History:  Diagnosis Date   Anxiety    Syncope     Past Surgical History:  Procedure Laterality Date   APPENDECTOMY     KNEE ARTHROSCOPY W/ LATERAL RELEASE Left 1997    Social History   Tobacco Use   Smoking status: Never   Smokeless tobacco: Never  Vaping Use   Vaping Use: Never used  Substance Use Topics   Alcohol use: Yes    Alcohol/week: 0.0 standard drinks    Comment: Occas   Drug use: No    Family History  Problem Relation Age of Onset   Ovarian cancer Mother    Breast cancer Paternal  Aunt 73   Ovarian cancer Maternal Grandmother    Stroke Maternal Grandfather    Diabetes Maternal Grandfather    Breast cancer Cousin 57       paternal 1st cousin    Allergies  Allergen Reactions   Codeine Nausea And Vomiting    Medication list has been reviewed and updated.  Current Outpatient Medications on File Prior to Visit  Medication Sig Dispense Refill   desogestrel-ethinyl estradiol (VIORELE) 0.15-0.02/0.01 MG (21/5) tablet Take 1 tablet by mouth daily. 84 tablet 3   sertraline (ZOLOFT) 50 MG tablet Take 1 tablet (50 mg total) by mouth daily. 90 tablet 3   No current facility-administered medications on file prior to visit.    Review of Systems:  As per HPI- otherwise negative.   Physical Examination: There were no vitals filed for this visit. There were no vitals filed for this visit. There is no height or weight on file to calculate BMI. Ideal Body Weight:    GEN: no acute distress. HEENT: Atraumatic, Normocephalic.  Ears and Nose: No external deformity. CV: RRR, No M/G/R. No JVD. No thrill. No extra heart sounds. PULM: CTA B, no wheezes, crackles, rhonchi. No retractions. No resp. distress. No accessory muscle use. ABD: S, NT, ND, +BS. No rebound. No HSM. EXTR: No c/c/e PSYCH: Normally interactive. Conversant.    Assessment and Plan: *** Physical exam today.  Encouraged healthy diet and exercise routine Signed Lamar Blinks, MD

## 2022-01-18 ENCOUNTER — Ambulatory Visit (INDEPENDENT_AMBULATORY_CARE_PROVIDER_SITE_OTHER): Payer: Commercial Managed Care - PPO | Admitting: Family Medicine

## 2022-01-18 ENCOUNTER — Encounter: Payer: Self-pay | Admitting: Family Medicine

## 2022-01-18 VITALS — BP 110/72 | HR 71 | Temp 97.9°F | Resp 18 | Ht 64.0 in | Wt 134.2 lb

## 2022-01-18 DIAGNOSIS — Z1329 Encounter for screening for other suspected endocrine disorder: Secondary | ICD-10-CM | POA: Diagnosis not present

## 2022-01-18 DIAGNOSIS — Z13220 Encounter for screening for lipoid disorders: Secondary | ICD-10-CM

## 2022-01-18 DIAGNOSIS — Z131 Encounter for screening for diabetes mellitus: Secondary | ICD-10-CM

## 2022-01-18 DIAGNOSIS — Z1159 Encounter for screening for other viral diseases: Secondary | ICD-10-CM

## 2022-01-18 DIAGNOSIS — Z0000 Encounter for general adult medical examination without abnormal findings: Secondary | ICD-10-CM | POA: Diagnosis not present

## 2022-01-18 DIAGNOSIS — R5383 Other fatigue: Secondary | ICD-10-CM

## 2022-01-18 DIAGNOSIS — F40243 Fear of flying: Secondary | ICD-10-CM

## 2022-01-18 DIAGNOSIS — Z1211 Encounter for screening for malignant neoplasm of colon: Secondary | ICD-10-CM

## 2022-01-18 LAB — BASIC METABOLIC PANEL
BUN: 16 mg/dL (ref 6–23)
CO2: 26 mEq/L (ref 19–32)
Calcium: 9.2 mg/dL (ref 8.4–10.5)
Chloride: 103 mEq/L (ref 96–112)
Creatinine, Ser: 0.87 mg/dL (ref 0.40–1.20)
GFR: 80.9 mL/min (ref >60.00)
Glucose, Bld: 81 mg/dL (ref 70–99)
Potassium: 4.1 mEq/L (ref 3.5–5.1)
Sodium: 138 mEq/L (ref 135–145)

## 2022-01-18 LAB — LIPID PANEL
Cholesterol: 190 mg/dL (ref 0–200)
HDL: 79.1 mg/dL (ref >39.00)
LDL Cholesterol: 93 mg/dL (ref 0–99)
NonHDL: 111.34
Total CHOL/HDL Ratio: 2
Triglycerides: 90 mg/dL (ref 0.0–149.0)
VLDL: 18 mg/dL (ref 0.0–40.0)

## 2022-01-18 LAB — HEMOGLOBIN A1C: Hgb A1c MFr Bld: 5.6 % (ref 4.6–6.5)

## 2022-01-18 LAB — TSH: TSH: 2.45 u[IU]/mL (ref 0.35–5.50)

## 2022-01-18 MED ORDER — ALPRAZOLAM 0.25 MG PO TABS: 0.2500 mg | ORAL_TABLET | Freq: Two times a day (BID) | ORAL | 0 refills | Status: DC | PRN

## 2022-01-19 LAB — HEPATITIS C ANTIBODY
Hepatitis C Ab: NONREACTIVE
SIGNAL TO CUT-OFF: 0.11 (ref <1.00)

## 2022-03-29 ENCOUNTER — Encounter: Payer: Self-pay | Admitting: Family Medicine

## 2022-03-29 ENCOUNTER — Telehealth: Payer: Self-pay | Admitting: Family Medicine

## 2022-03-29 NOTE — Telephone Encounter (Signed)
Placed in folder for signature. Would you like to see her- offer a VV for her sxs?

## 2022-03-29 NOTE — Telephone Encounter (Signed)
Patient called to get an update on the physical form that she faxed over to [place] yesterday, she wanted to make sure we received it. Patient also states her daughter has pink eye and she has started to develop pink eye as well so she would like something sent to her pharmacy. Please advise.

## 2022-03-29 NOTE — Telephone Encounter (Signed)
Pt notes she is using some drops leftover drop from her daughter's recent pinkeye.  She is not sure of what the rx is exactly but notes she is improving. She does not wear contacts.  She plans to self- treat for a week and let me know if anything else is needed  Completed brief CPE form for her job

## 2022-04-07 ENCOUNTER — Telehealth: Payer: Self-pay | Admitting: Family Medicine

## 2022-04-07 NOTE — Telephone Encounter (Signed)
Pt called stating that her ins had told her that there was an error in transmitting a form over to them for a healthy premium discount and she was wondering if we could resend it.   F: [phone]

## 2022-04-07 NOTE — Telephone Encounter (Signed)
Do not see form in chart. Will have to wait for Kristine Garbe to return next week.

## 2022-04-10 NOTE — Telephone Encounter (Signed)
[place] like form is at scan and has not been scanned in yet. Unfortunately I did not file a copy. Called to ask pt if this form was from insurance so maybe they could send a new form over and we could just redo it.

## 2022-04-17 NOTE — Telephone Encounter (Signed)
Patient called back and was informed of message, she will call her ins and get the form faxed over to [place] asap.

## 2022-04-18 NOTE — Telephone Encounter (Signed)
Form has been faxed.

## 2022-04-18 NOTE — Telephone Encounter (Signed)
Patient stated her waist circumference is 28 inches.

## 2022-04-18 NOTE — Telephone Encounter (Signed)
Called pt to let her know that her form is complete, but we need to know her waist circumference. Left vm to return call.

## 2022-07-12 HISTORY — PX: ANTERIOR CRUCIATE LIGAMENT REPAIR: SHX115

## 2022-08-25 ENCOUNTER — Other Ambulatory Visit: Payer: Self-pay | Admitting: *Deleted

## 2022-08-25 DIAGNOSIS — Z3041 Encounter for surveillance of contraceptive pills: Secondary | ICD-10-CM

## 2022-08-25 MED ORDER — DESOGESTREL-ETHINYL ESTRADIOL 0.15-0.02/0.01 MG (21/5) PO TABS: 1.0000 | ORAL_TABLET | Freq: Every day | ORAL | 1 refills | Status: DC

## 2022-08-25 NOTE — Telephone Encounter (Signed)
Med refill request:Viorelle Tab Last AEX: 08/17/21 -TW Next AEX:Not scheduled Last MMG (if hormonal med) 12/23/21 -BiRads 1 neg Refill authorized: Please Advise?  Rx pended for #28/1RF Needs AEX for future refills.

## 2022-08-28 ENCOUNTER — Encounter: Payer: Self-pay | Admitting: Family Medicine

## 2022-08-28 ENCOUNTER — Other Ambulatory Visit: Payer: Self-pay | Admitting: Nurse Practitioner

## 2022-08-28 DIAGNOSIS — F32A Depression, unspecified: Secondary | ICD-10-CM

## 2022-08-29 NOTE — Telephone Encounter (Signed)
Last AEX 08/17/2021--nothing scheduled. Recall was placed.   Will send msg to appt desk to contact to schedule AEX.

## 2022-08-30 ENCOUNTER — Telehealth: Payer: Self-pay | Admitting: Family Medicine

## 2022-08-30 NOTE — Telephone Encounter (Signed)
08/29/2022 Melton Alar, CMA Left message for patient to call and schedule appointment

## 2022-08-30 NOTE — Telephone Encounter (Signed)
Pt called asking if Dr. Lorelei Pont could take over prescribing the following medication and send a refill:  Prescription Request  08/30/2022  Is this a "Controlled Substance" medicine? No  LOV: Visit date not found  What is the name of the medication or equipment?   sertraline (ZOLOFT) 50 MG tablet [[id number]]   Have you contacted your pharmacy to request a refill? No   Which pharmacy would you like this sent to?   [id number] [address] AT [facility] OF [address] [place] [address] Phone: [phone] Fax: [phone]   Patient notified that their request is being sent to the clinical staff for review and that they should receive a response within 2 business days.   Please advise at Mobile [phone] (mobile)

## 2022-08-31 ENCOUNTER — Other Ambulatory Visit: Payer: Self-pay | Admitting: Family Medicine

## 2022-08-31 DIAGNOSIS — F419 Anxiety disorder, unspecified: Secondary | ICD-10-CM

## 2022-09-01 ENCOUNTER — Encounter: Payer: Self-pay | Admitting: Family Medicine

## 2022-09-01 NOTE — Telephone Encounter (Signed)
Looks like medication was sent in already by cma as refill, but will you be ok with this?

## 2022-09-01 NOTE — Telephone Encounter (Signed)
FYI. Pt scheduled for 09/20/2022. However, looks like PCP sent in refills for her.

## 2022-09-10 NOTE — Progress Notes (Unsigned)
[place] at [facility] [address], [place], [place] [address] [phone]  Date:  09/14/2022   Name:  Kelly Stanley   DOB:  Oct 26, 1975   MRN:  [id number]  PCP:  Darreld Mclean, MD    Chief Complaint: No chief complaint on file.   History of Present Illness:  Kelly Stanley is a 47 y.o. very pleasant female patient who presents with the following:  Patient seen today with a concern about her arm She is generally in good health She contacted me recently via MyChart: I tore my ACL and meniscus in November and ended up having ACL replacement as well as meniscus repair on November 22. My PT has been going well, and I seem to be progressing well. Lately, I have been experiencing a weird feeling in my left arm. It feels like I have a tourniquet around it. It comes and goes. I just wanted to email you to see if this is something I need to get checked out? My IV was placed in my left hand, so not sure if that has anything to do with it. I am not experiencing any other symptoms. Thank you again for your time.   Pap screening Colon cancer screening Patient Active Problem List   Diagnosis Date Noted   Post concussion syndrome 10/02/2014   Allergic contact dermatitis 01/26/2014   Anxiety and depression 11/17/2013   Chronic night sweats 11/17/2013   Family history of ovarian cancer 11/17/2013   Allergic rhinitis 11/17/2013   Orthostatic hypotension 11/17/2013    Past Medical History:  Diagnosis Date   Anxiety    Syncope     Past Surgical History:  Procedure Laterality Date   APPENDECTOMY     KNEE ARTHROSCOPY W/ LATERAL RELEASE Left 1997    Social History   Tobacco Use   Smoking status: Never   Smokeless tobacco: Never  Vaping Use   Vaping Use: Never used  Substance Use Topics   Alcohol use: Yes    Alcohol/week: 0.0 standard drinks of alcohol    Comment: Occas   Drug use: No    Family History  Problem  Relation Age of Onset   Ovarian cancer Mother    Breast cancer Paternal Aunt 34   Ovarian cancer Maternal Grandmother    Stroke Maternal Grandfather    Diabetes Maternal Grandfather    Breast cancer Cousin 56       paternal 1st cousin    Allergies  Allergen Reactions   Codeine Nausea And Vomiting    Medication list has been reviewed and updated.  Current Outpatient Medications on File Prior to Visit  Medication Sig Dispense Refill   ALPRAZolam (XANAX) 0.25 MG tablet Take 1 tablet (0.25 mg total) by mouth 2 (two) times daily as needed for anxiety. 20 tablet 0   desogestrel-ethinyl estradiol (VIORELE) 0.15-0.02/0.01 MG (21/5) tablet Take 1 tablet by mouth daily. 28 tablet 1   sertraline (ZOLOFT) 100 MG tablet TAKE 1/2 TABLET BY MOUTH DAILY 45 tablet 4   sertraline (ZOLOFT) 50 MG tablet Take 1 tablet (50 mg total) by mouth daily. 90 tablet 3   No current facility-administered medications on file prior to visit.    Review of Systems:  As per HPI- otherwise negative.   Physical Examination: There were no vitals filed for this visit. There were no vitals filed for this visit. There is no height or weight on file to calculate BMI. Ideal  Body Weight:    GEN: no acute distress. HEENT: Atraumatic, Normocephalic.  Ears and Nose: No external deformity. CV: RRR, No M/G/R. No JVD. No thrill. No extra heart sounds. PULM: CTA B, no wheezes, crackles, rhonchi. No retractions. No resp. distress. No accessory muscle use. ABD: S, NT, ND, +BS. No rebound. No HSM. EXTR: No c/c/e PSYCH: Normally interactive. Conversant.    Assessment and Plan: ***  Signed Lamar Blinks, MD

## 2022-09-14 ENCOUNTER — Ambulatory Visit (INDEPENDENT_AMBULATORY_CARE_PROVIDER_SITE_OTHER): Payer: Commercial Managed Care - PPO | Admitting: Family Medicine

## 2022-09-14 ENCOUNTER — Encounter: Payer: Self-pay | Admitting: Family Medicine

## 2022-09-14 VITALS — BP 120/78 | HR 84 | Ht 64.0 in | Wt 130.4 lb

## 2022-09-14 DIAGNOSIS — F32A Depression, unspecified: Secondary | ICD-10-CM | POA: Diagnosis not present

## 2022-09-14 DIAGNOSIS — R079 Chest pain, unspecified: Secondary | ICD-10-CM

## 2022-09-14 DIAGNOSIS — F419 Anxiety disorder, unspecified: Secondary | ICD-10-CM

## 2022-09-14 DIAGNOSIS — R200 Anesthesia of skin: Secondary | ICD-10-CM | POA: Diagnosis not present

## 2022-09-14 LAB — POCT URINE PREGNANCY: Preg Test, Ur: NEGATIVE

## 2022-09-14 LAB — COMPREHENSIVE METABOLIC PANEL
ALT: 14 U/L (ref 0–35)
AST: 20 U/L (ref 0–37)
Albumin: 4 g/dL (ref 3.5–5.2)
Alkaline Phosphatase: 47 U/L (ref 39–117)
BUN: 11 mg/dL (ref 6–23)
CO2: 28 mEq/L (ref 19–32)
Calcium: 8.9 mg/dL (ref 8.4–10.5)
Chloride: 102 mEq/L (ref 96–112)
Creatinine, Ser: 0.8 mg/dL (ref 0.40–1.20)
GFR: 89.06 mL/min (ref >60.00)
Glucose, Bld: 95 mg/dL (ref 70–99)
Potassium: 3.9 mEq/L (ref 3.5–5.1)
Sodium: 138 mEq/L (ref 135–145)
Total Bilirubin: 0.5 mg/dL (ref 0.2–1.2)
Total Protein: 6.8 g/dL (ref 6.0–8.3)

## 2022-09-14 LAB — TROPONIN I (HIGH SENSITIVITY): High Sens Troponin I: 8 ng/L (ref 2–17)

## 2022-09-14 LAB — CBC
HCT: 38.8 % (ref 36.0–46.0)
Hemoglobin: 13.4 g/dL (ref 12.0–15.0)
MCHC: 34.5 g/dL (ref 30.0–36.0)
MCV: 92.4 fl (ref 78.0–100.0)
Platelets: 242 10*3/uL (ref 150.0–400.0)
RBC: 4.2 Mil/uL (ref 3.87–5.11)
RDW: 11.9 % (ref 11.5–15.5)
WBC: 8.3 10*3/uL (ref 4.0–10.5)

## 2022-09-14 LAB — D-DIMER, QUANTITATIVE: D-Dimer, Quant: 0.36 mcg/mL FEU (ref <0.50)

## 2022-09-14 MED ORDER — SERTRALINE HCL 100 MG PO TABS
ORAL_TABLET | ORAL | 3 refills | Status: AC
Start: 2022-09-14 — End: ?

## 2022-09-14 NOTE — Patient Instructions (Signed)
Good to see you today I will be in touch with your labs asap- please get your chest film and neck films asap IF your D dimer or troponin are elevated we will take further action right away  Otherwise, please let me know how you are doing over the next few days Let's increase your sertraline to 100 mg Alprazolam can be used as needed Please let me know if anything is changing or getting worse

## 2022-09-19 ENCOUNTER — Encounter: Payer: Self-pay | Admitting: Family Medicine

## 2022-09-20 ENCOUNTER — Encounter: Payer: Self-pay | Admitting: Nurse Practitioner

## 2022-09-20 ENCOUNTER — Encounter: Payer: Self-pay | Admitting: Family Medicine

## 2022-09-20 ENCOUNTER — Other Ambulatory Visit (HOSPITAL_COMMUNITY)
Admission: RE | Admit: 2022-09-20 | Discharge: 2022-09-20 | Disposition: A | Discharge status: Home / Self Care | Payer: Commercial Managed Care - PPO | Source: Ambulatory Visit | Attending: Nurse Practitioner | Admitting: Nurse Practitioner

## 2022-09-20 ENCOUNTER — Ambulatory Visit (INDEPENDENT_AMBULATORY_CARE_PROVIDER_SITE_OTHER): Payer: Commercial Managed Care - PPO | Admitting: Nurse Practitioner

## 2022-09-20 ENCOUNTER — Ambulatory Visit (INDEPENDENT_AMBULATORY_CARE_PROVIDER_SITE_OTHER): Payer: Commercial Managed Care - PPO

## 2022-09-20 VITALS — BP 100/64 | Ht 63.5 in | Wt 130.0 lb

## 2022-09-20 DIAGNOSIS — R200 Anesthesia of skin: Secondary | ICD-10-CM | POA: Diagnosis not present

## 2022-09-20 DIAGNOSIS — Z1211 Encounter for screening for malignant neoplasm of colon: Secondary | ICD-10-CM

## 2022-09-20 DIAGNOSIS — R079 Chest pain, unspecified: Secondary | ICD-10-CM | POA: Diagnosis not present

## 2022-09-20 DIAGNOSIS — Z3041 Encounter for surveillance of contraceptive pills: Secondary | ICD-10-CM | POA: Diagnosis not present

## 2022-09-20 DIAGNOSIS — Z01419 Encounter for gynecological examination (general) (routine) without abnormal findings: Secondary | ICD-10-CM | POA: Diagnosis present

## 2022-09-20 MED ORDER — DESOGESTREL-ETHINYL ESTRADIOL 0.15-0.02/0.01 MG (21/5) PO TABS: 1.0000 | ORAL_TABLET | Freq: Every day | ORAL | 3 refills | Status: DC

## 2022-09-20 NOTE — Progress Notes (Signed)
Jeslin Bazinet 1975/09/07 [id number]   History:  47 y.o. [id number] presents for annual exam. Monthly cycles. On COCs. LGSIL 2005-2007, negative colposcopy at that time, normal pap smears since then. Anxiety has been worse due to recent surgery and death in family. PCP increased Zoloft to 100 mg, patient has not started yet.   Gynecologic History Patient's last menstrual period was 09/06/2022 (approximate). Period Cycle (Days): 28 Period Duration (Days): 7 Period Pattern: Regular Menstrual Flow: Moderate Menstrual Control: Tampon Dysmenorrhea: (!) Mild Dysmenorrhea Symptoms: Cramping Contraception/Family planning: OCP (estrogen/progesterone) Sexually active: Yes  Health Maintenance Last Pap: 06/04/2018. Results were: Normal neg HPV, 5-year repeat Last mammogram: 12/23/2021. Results were: Normal Last colonoscopy: 2010. Results were: Normal Last Dexa: Not indicated  Past medical history, past surgical history, family history and social history were all reviewed and documented in the EPIC chart. Married. 38 yo son, 8 yo daughter. 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 Husband is also teacher there. Mother and MGM with history of ovarian cancer, negative genetic testing.   ROS:  A ROS was performed and pertinent positives and negatives are included.  Exam:  Vitals:   09/20/22 0813  BP: 100/64  Weight: 130 lb (59 kg)  Height: 5' 3.5" (1.613 m)    Body mass index is 22.67 kg/m.  General appearance:  Normal Thyroid:  Symmetrical, normal in size, without palpable masses or nodularity. Respiratory  Auscultation:  Clear without wheezing or rhonchi Cardiovascular  Auscultation:  Regular rate, without rubs, murmurs or gallops  Edema/varicosities:  Not grossly evident Abdominal  Soft,nontender, without masses, guarding or rebound.  Liver/spleen:  No organomegaly noted  Hernia:  None appreciated  Skin  Inspection:  Grossly normal Breasts: Examined lying and  sitting.   Right: Without masses, retractions, nipple discharge or axillary adenopathy.   Left: Without masses, retractions, nipple discharge or axillary adenopathy. Genitourinary   Inguinal/mons:  Normal without inguinal adenopathy  External genitalia:  Normal appearing vulva with no masses, tenderness, or lesions  BUS/Urethra/Skene's glands:  Normal  Vagina:  Normal appearing with normal color and discharge, no lesions  Cervix:  Normal appearing without discharge or lesions  Uterus:  Normal in size, shape and contour.  Midline and mobile, nontender  Adnexa/parametria:     Rt: Normal in size, without masses or tenderness.   Lt: Normal in size, without masses or tenderness.  Anus and perineum: Normal  Digital rectal exam: Deferred  Patient informed chaperone available to be present for breast and pelvic exam. Patient has requested no chaperone to be present. Patient has been advised what will be completed during breast and pelvic exam.   Assessment/Plan:  47 y.o. [id number] for annual exam.   Well female exam with routine gynecological exam - Plan: Cytology - PAP( [place]). Education provided on SBEs, importance of preventative screenings, current guidelines, high calcium diet, regular exercise, and multivitamin daily.  Labs with PCP.   Encounter for surveillance of contraceptive pills - Plan: desogestrel-ethinyl estradiol (VIORELE) 0.15-0.02/0.01 MG (21/5) tablet daily. Taking as prescribed. Refill x 1 year provided.   Screening for colon cancer - Plan: Cologuard. Average risk. Cologuard versus colonoscopy discussed.   Screening for cervical cancer -  LGSIL 2005-2007, negative colposcopy at that time, normal pap smears since then. Pap today.  Screening for breast cancer - Normal mammogram history.  Continue annual screenings.  Normal breast exam today.  Return in 1 year for annual.       Tamela Gammon DNP, 9:19 AM 09/20/2022

## 2022-09-26 LAB — CYTOLOGY - PAP
Comment: NEGATIVE
Diagnosis: UNDETERMINED — AB
High risk HPV: NEGATIVE

## 2022-10-31 LAB — COLOGUARD: COLOGUARD: NEGATIVE

## 2022-11-03 ENCOUNTER — Encounter (HOSPITAL_BASED_OUTPATIENT_CLINIC_OR_DEPARTMENT_OTHER): Payer: Self-pay | Admitting: Emergency Medicine

## 2022-11-03 ENCOUNTER — Emergency Department (HOSPITAL_BASED_OUTPATIENT_CLINIC_OR_DEPARTMENT_OTHER)
Admission: EM | Admit: 2022-11-03 | Discharge: 2022-11-03 | Disposition: A | Discharge status: Home / Self Care | Payer: Commercial Managed Care - PPO | Attending: Emergency Medicine | Admitting: Emergency Medicine

## 2022-11-03 ENCOUNTER — Other Ambulatory Visit: Payer: Self-pay

## 2022-11-03 ENCOUNTER — Emergency Department (HOSPITAL_BASED_OUTPATIENT_CLINIC_OR_DEPARTMENT_OTHER): Payer: Commercial Managed Care - PPO

## 2022-11-03 DIAGNOSIS — R1030 Lower abdominal pain, unspecified: Secondary | ICD-10-CM | POA: Diagnosis present

## 2022-11-03 DIAGNOSIS — N200 Calculus of kidney: Secondary | ICD-10-CM

## 2022-11-03 LAB — CBC
HCT: 38.3 % (ref 36.0–46.0)
Hemoglobin: 13.3 g/dL (ref 12.0–15.0)
MCH: 31.9 pg (ref 26.0–34.0)
MCHC: 34.7 g/dL (ref 30.0–36.0)
MCV: 91.8 fL (ref 80.0–100.0)
Platelets: 219 10*3/uL (ref 150–400)
RBC: 4.17 MIL/uL (ref 3.87–5.11)
RDW: 11.4 % — ABNORMAL LOW (ref 11.5–15.5)
WBC: 5.2 10*3/uL (ref 4.0–10.5)
nRBC: 0 % (ref 0.0–0.2)

## 2022-11-03 LAB — COMPREHENSIVE METABOLIC PANEL
ALT: 16 U/L (ref 0–44)
AST: 22 U/L (ref 15–41)
Albumin: 3.5 g/dL (ref 3.5–5.0)
Alkaline Phosphatase: 41 U/L (ref 38–126)
Anion gap: 5 (ref 5–15)
BUN: 17 mg/dL (ref 6–20)
CO2: 27 mmol/L (ref 22–32)
Calcium: 8.8 mg/dL — ABNORMAL LOW (ref 8.9–10.3)
Chloride: 105 mmol/L (ref 98–111)
Creatinine, Ser: 0.83 mg/dL (ref 0.44–1.00)
GFR, Estimated: >60 mL/min (ref >60)
Glucose, Bld: 77 mg/dL (ref 70–99)
Potassium: 3.8 mmol/L (ref 3.5–5.1)
Sodium: 137 mmol/L (ref 135–145)
Total Bilirubin: 0.8 mg/dL (ref 0.3–1.2)
Total Protein: 6.5 g/dL (ref 6.5–8.1)

## 2022-11-03 LAB — URINALYSIS, MICROSCOPIC (REFLEX)

## 2022-11-03 LAB — URINALYSIS, ROUTINE W REFLEX MICROSCOPIC
Bilirubin Urine: NEGATIVE
Glucose, UA: NEGATIVE mg/dL
Ketones, ur: NEGATIVE mg/dL
Leukocytes,Ua: NEGATIVE
Nitrite: NEGATIVE
Protein, ur: NEGATIVE mg/dL
Specific Gravity, Urine: >=1.03 (ref 1.005–1.030)
pH: 5.5 (ref 5.0–8.0)

## 2022-11-03 LAB — PREGNANCY, URINE: Preg Test, Ur: NEGATIVE

## 2022-11-03 LAB — LIPASE, BLOOD: Lipase: 38 U/L (ref 11–51)

## 2022-11-03 LAB — CBG MONITORING, ED: Glucose-Capillary: 108 mg/dL — ABNORMAL HIGH (ref 70–99)

## 2022-11-03 MED ORDER — IOHEXOL 300 MG/ML  SOLN
100.0000 mL | Freq: Once | INTRAMUSCULAR | Status: AC | PRN
  Administered 2022-11-03: 100 mL via INTRAVENOUS

## 2022-11-03 MED ORDER — ONDANSETRON HCL 4 MG/2ML IJ SOLN: 4.0000 mg | Freq: Once | INTRAMUSCULAR | Status: DC

## 2022-11-03 MED ORDER — SODIUM CHLORIDE 0.9 % IV BOLUS
1000.0000 mL | Freq: Once | INTRAVENOUS | Status: AC
  Administered 2022-11-03: 1000 mL via INTRAVENOUS

## 2022-11-03 NOTE — ED Notes (Signed)
Patient has a urine specimen in the lab 

## 2022-11-03 NOTE — ED Notes (Signed)
Patient transported to CT 

## 2022-11-03 NOTE — ED Notes (Signed)
Triage delay d/t pt in restroom

## 2022-11-03 NOTE — ED Triage Notes (Signed)
Pt arrives pov, steady gait, c/o lower ABD pain, with diarrhea today, endorses dizziness and bilat. lower back pain x 1 week. Pt AOx 4, speech clear, bilaterally equal

## 2022-11-03 NOTE — ED Notes (Signed)
Pt ambulatory to restroom

## 2022-11-03 NOTE — ED Notes (Signed)
Pt discharged to home. Discharge instructions have been discussed with patient and/or family members. Pt verbally acknowledges understanding d/c instructions, and endorses comprehension to checkout at registration before leaving.  °

## 2022-11-03 NOTE — ED Provider Notes (Signed)
[facility] [facility] AT [place] [address] Provider Note   CSN: [id number] Arrival date & time: 11/03/22  [id number]     History  Chief Complaint  Patient presents with   Abdominal Pain    Kelly Stanley is a 47 y.o. female who presents emergency department with concerns for bilateral lower abdominal pain onset this morning that lasted for approximately 45 minutes.  Denies abdominal pain at this time.  Has associated nausea, diarrhea, lower back pain x 1 week, dizziness x 1 week.  Notes that her dizziness is worse with position changes.  Denies recent fall, injury, trauma, heavy lifting. Currently on her menstrual cycle.  Denies vaginal discharge, urinary symptoms, headache, chest pain, shortness of breath.  Denies past medical history of UTI or kidney stones.  The history is provided by the patient. No language interpreter was used.       Home Medications Prior to Admission medications   Medication Sig Start Date End Date Taking? Authorizing Provider  ALPRAZolam (XANAX) 0.25 MG tablet Take 1 tablet (0.25 mg total) by mouth 2 (two) times daily as needed for anxiety. 01/18/22   Copland, Gay Filler, MD  desogestrel-ethinyl estradiol (VIORELE) 0.15-0.02/0.01 MG (21/5) tablet Take 1 tablet by mouth daily. 09/20/22   Tamela Gammon, NP  sertraline (ZOLOFT) 100 MG tablet TAKE 1 TABLET BY MOUTH DAILY 09/14/22   Copland, Gay Filler, MD      Allergies    Codeine    Review of Systems   Review of Systems  Gastrointestinal:  Positive for abdominal pain.  All other systems reviewed and are negative.   Physical Exam Updated Vital Signs BP 123/77   Pulse (!) 58   Temp 97.9 F (36.6 C)   Resp 15   Ht 5\' 4"  (1.626 m)   Wt 59 kg   LMP 10/29/2022   SpO2 100%   BMI 22.31 kg/m  Physical Exam Vitals and nursing note reviewed.  Constitutional:      General: She is not in acute distress.    Appearance: She is not diaphoretic.  HENT:     Head: Normocephalic and atraumatic.      Mouth/Throat:     Pharynx: No oropharyngeal exudate.  Eyes:     General: No scleral icterus.    Conjunctiva/sclera: Conjunctivae normal.  Cardiovascular:     Rate and Rhythm: Normal rate and regular rhythm.     Pulses: Normal pulses.     Heart sounds: Normal heart sounds.  Pulmonary:     Effort: Pulmonary effort is normal. No respiratory distress.     Breath sounds: Normal breath sounds. No wheezing.  Abdominal:     General: Bowel sounds are normal.     Palpations: Abdomen is soft. There is no mass.     Tenderness: There is no abdominal tenderness. There is no right CVA tenderness, left CVA tenderness, guarding or rebound.     Comments: No abdominal tenderness to palpation.  No CVA tenderness to palpation noted bilaterally.  Musculoskeletal:        General: Normal range of motion.     Cervical back: Normal range of motion and neck supple.     Comments: No spinal tenderness to palpation.  No tenderness to palpation noted to musculature of back.  Skin:    General: Skin is warm and dry.  Neurological:     Mental Status: She is alert.  Psychiatric:        Behavior: Behavior normal.     ED  Results / Procedures / Treatments   Labs (all labs ordered are listed, but only abnormal results are displayed) Labs Reviewed  COMPREHENSIVE METABOLIC PANEL - Abnormal; Notable for the following components:      Result Value   Calcium 8.8 (*)    All other components within normal limits  CBC - Abnormal; Notable for the following components:   RDW 11.4 (*)    All other components within normal limits  URINALYSIS, ROUTINE W REFLEX MICROSCOPIC - Abnormal; Notable for the following components:   Hgb urine dipstick TRACE (*)    All other components within normal limits  URINALYSIS, MICROSCOPIC (REFLEX) - Abnormal; Notable for the following components:   Bacteria, UA MANY (*)    All other components within normal limits  CBG MONITORING, ED - Abnormal; Notable for the following components:    Glucose-Capillary 108 (*)    All other components within normal limits  LIPASE, BLOOD  PREGNANCY, URINE    EKG EKG Interpretation  Date/Time:  Friday November 03 2022 09:12:23 EDT Ventricular Rate:  49 PR Interval:  150 QRS Duration: 92 QT Interval:  464 QTC Calculation: 419 R Axis:   95 Text Interpretation: Sinus bradycardia Borderline right axis deviation Since last tracing rate slower Confirmed by Dorie Rank [phone]) on 11/03/2022 9:15:34 AM  Radiology CT ABDOMEN PELVIS W CONTRAST  Result Date: 11/03/2022 CLINICAL DATA:  Lower abdominal pain, back pain, and diarrhea. EXAM: CT ABDOMEN AND PELVIS WITH CONTRAST TECHNIQUE: Multidetector CT imaging of the abdomen and pelvis was performed using the standard protocol following bolus administration of intravenous contrast. RADIATION DOSE REDUCTION: This exam was performed according to the departmental dose-optimization program which includes automated exposure control, adjustment of the mA and/or kV according to patient size and/or use of iterative reconstruction technique. CONTRAST:  156mL OMNIPAQUE IOHEXOL 300 MG/ML  SOLN COMPARISON:  None Available. FINDINGS: Lower chest: Clear lung bases. Hepatobiliary: No focal liver abnormality is seen. No gallstones, gallbladder wall thickening, or biliary dilatation. Pancreas: Unremarkable. Spleen: Unremarkable. Adrenals/Urinary Tract: Unremarkable adrenal glands. 1 mm calculus in the lower pole of the left kidney. No hydronephrosis or renal mass. Unremarkable bladder. Stomach/Bowel: The stomach is unremarkable. There is no evidence of bowel obstruction. Lack of distension of multiple small bowel loops and of the left colon and rectum limits assessment for bowel wall thickening. History of appendectomy. Vascular/Lymphatic: Normal caliber of the abdominal aorta. No enlarged lymph nodes. Reproductive: Uterus and bilateral adnexa are unremarkable. Other: Trace pelvic free fluid, potentially physiologic. No  organized fluid collection or pneumoperitoneum. Musculoskeletal: No acute osseous abnormality or suspicious osseous lesion. IMPRESSION: 1. No acute abnormality identified in the abdomen or pelvis. 2. Nonobstructing left renal calculus. Electronically Signed   By: Logan Bores M.D.   On: 11/03/2022 11:46    Procedures Procedures    Medications Ordered in ED Medications  ondansetron (ZOFRAN) injection 4 mg (4 mg Intravenous Not Given 11/03/22 0929)  sodium chloride 0.9 % bolus 1,000 mL ( Intravenous Stopped 11/03/22 1034)  iohexol (OMNIPAQUE) 300 MG/ML solution 100 mL (100 mLs Intravenous Contrast Given 11/03/22 1122)    ED Course/ Medical Decision Making/ A&P Clinical Course as of 11/03/22 1213  Fri Nov 03, 2022  1003 Pt re-evaluated and asleep on stretcher. Discussed with patient lab findings. Answered all available questions.  [SB]  1155 Patient visualized ambulating in the emergency department without assistance or difficulty from the restroom to her room.  [SB]  1200 Re-evaluated and noted improvement of symptoms with treatment regimen. Discussed  discharge treatment plan. Pt agreeable at this time. Pt appears safe for discharge. [SB]    Clinical Course User Index [SB] Andrew Blasius A, PA-C                             Medical Decision Making Amount and/or Complexity of Data Reviewed Labs: ordered. Radiology: ordered.  Risk Prescription drug management.   Patient presents to the emergency department with lower abdominal pain x this morning. On exam patient with no abdominal tenderness to palpation.  No acute cardiovascular, respiratory exam findings. Pt afebrile. Differential diagnosis includes pancreatitis, cholecystitis, appendicitis, acute cystitis, pyelonephritis, nephrolithiasis, hypoglycemia, electrolyte abnormality, arrhythmia.   Labs:  I ordered, and personally interpreted labs.  The pertinent results include:  Lipase unremarkable CBC without leukocytosis, hemoglobin  stable CMP unremarkable Pregnancy urine negative CBG slightly elevated at 108 otherwise unremarkable Urinalysis with trace hemoglobin, patient currently on her menstrual cycle  Imaging: I ordered imaging studies including CT abdomen pelvis with I independently visualized and interpreted imaging which showed  1. No acute abnormality identified in the abdomen or pelvis.  2. Nonobstructing left renal calculus.   I agree with the radiologist interpretation  Medications:  I ordered medication including IVF for symptom management . Reevaluation of the patient after these medicines and interventions, I reevaluated the patient and found that they have improved I have reviewed the patients home medicines and have made adjustments as needed   Disposition: Presentation suspicious for lower abdominal pain.  Doubt arrhythmia, electrolyte abnormality, hypoglycemia.  Doubt concerns at this time for diverticulitis, pancreatitis, acute cystitis, pyelonephritis.  Likely lower abdominal pain in the setting of menstrual cycle.  Also incidental finding of nephrolithiasis on CT scan.  Patient asymptomatic throughout duration of ED visit.  Symptoms improved with IV fluids in the emergency department. After consideration of the diagnostic results and the patients response to treatment, I feel that the patient would benefit from Discharge home.  Instructed patient to follow-up with primary care provider regarding today's ED visit.  Supportive care measures and strict return precautions discussed with patient at bedside. Pt acknowledges and verbalizes understanding. Pt appears safe for discharge. Follow up as indicated in discharge paperwork.   This chart was dictated using voice recognition software, Dragon. Despite the best efforts of this provider to proofread and correct errors, errors may still occur which can change documentation meaning.  Final Clinical Impression(s) / ED Diagnoses Final diagnoses:  Lower  abdominal pain  Nephrolithiasis    Rx / DC Orders ED Discharge Orders     None         Kinlee Garrison A, PA-C 11/03/22 1216    Dorie Rank, MD 11/04/22 [phone]

## 2022-11-03 NOTE — Discharge Instructions (Addendum)
It was a pleasure taking care of you today!  Your labs did not show any concerning emergent findings today.  Your CT scan showed an incidental nonobstructing kidney stone on the left, this will likely pass on its own otherwise no concerning emergent findings today.  Ensure to maintain fluid intake with water, tea, broth, soup, Pedialyte, Gatorade.  Follow-up with your primary care provider regarding today's ED visit.  Return to the emergency department if you experience increasing/worsening symptoms.

## 2022-11-24 ENCOUNTER — Other Ambulatory Visit: Payer: Self-pay | Admitting: Family Medicine

## 2022-11-24 DIAGNOSIS — Z1231 Encounter for screening mammogram for malignant neoplasm of breast: Secondary | ICD-10-CM

## 2022-12-26 ENCOUNTER — Encounter: Payer: Self-pay | Admitting: Family Medicine

## 2022-12-26 NOTE — Telephone Encounter (Signed)
Before I ask the pt to send the results, are you comfortable doing this?

## 2023-01-02 ENCOUNTER — Ambulatory Visit
Admission: RE | Admit: 2023-01-02 | Discharge: 2023-01-02 | Disposition: A | Discharge status: Home / Self Care | Payer: Commercial Managed Care - PPO | Source: Ambulatory Visit | Attending: Family Medicine | Admitting: Family Medicine

## 2023-01-02 DIAGNOSIS — Z1231 Encounter for screening mammogram for malignant neoplasm of breast: Secondary | ICD-10-CM

## 2023-01-05 ENCOUNTER — Emergency Department (HOSPITAL_BASED_OUTPATIENT_CLINIC_OR_DEPARTMENT_OTHER)
Admission: EM | Admit: 2023-01-05 | Discharge: 2023-01-06 | Disposition: A | Discharge status: Home / Self Care | Payer: Commercial Managed Care - PPO | Attending: Emergency Medicine | Admitting: Emergency Medicine

## 2023-01-05 ENCOUNTER — Emergency Department (HOSPITAL_BASED_OUTPATIENT_CLINIC_OR_DEPARTMENT_OTHER): Payer: Commercial Managed Care - PPO

## 2023-01-05 ENCOUNTER — Encounter (HOSPITAL_BASED_OUTPATIENT_CLINIC_OR_DEPARTMENT_OTHER): Payer: Self-pay | Admitting: Emergency Medicine

## 2023-01-05 ENCOUNTER — Other Ambulatory Visit: Payer: Self-pay

## 2023-01-05 DIAGNOSIS — R531 Weakness: Secondary | ICD-10-CM | POA: Diagnosis not present

## 2023-01-05 DIAGNOSIS — R251 Tremor, unspecified: Secondary | ICD-10-CM | POA: Diagnosis not present

## 2023-01-05 DIAGNOSIS — R42 Dizziness and giddiness: Secondary | ICD-10-CM | POA: Diagnosis present

## 2023-01-05 LAB — URINALYSIS, ROUTINE W REFLEX MICROSCOPIC
Bilirubin Urine: NEGATIVE
Glucose, UA: NEGATIVE mg/dL
Hgb urine dipstick: NEGATIVE
Ketones, ur: NEGATIVE mg/dL
Leukocytes,Ua: NEGATIVE
Nitrite: NEGATIVE
Protein, ur: NEGATIVE mg/dL
Specific Gravity, Urine: <=1.005 (ref 1.005–1.030)
pH: 6 (ref 5.0–8.0)

## 2023-01-05 LAB — PREGNANCY, URINE: Preg Test, Ur: NEGATIVE

## 2023-01-05 LAB — BASIC METABOLIC PANEL
Anion gap: 8 (ref 5–15)
BUN: 14 mg/dL (ref 6–20)
CO2: 24 mmol/L (ref 22–32)
Calcium: 8.5 mg/dL — ABNORMAL LOW (ref 8.9–10.3)
Chloride: 105 mmol/L (ref 98–111)
Creatinine, Ser: 0.76 mg/dL (ref 0.44–1.00)
GFR, Estimated: >60 mL/min (ref >60)
Glucose, Bld: 103 mg/dL — ABNORMAL HIGH (ref 70–99)
Potassium: 3.4 mmol/L — ABNORMAL LOW (ref 3.5–5.1)
Sodium: 137 mmol/L (ref 135–145)

## 2023-01-05 LAB — CBC
HCT: 36.3 % (ref 36.0–46.0)
Hemoglobin: 12.6 g/dL (ref 12.0–15.0)
MCH: 31.7 pg (ref 26.0–34.0)
MCHC: 34.7 g/dL (ref 30.0–36.0)
MCV: 91.2 fL (ref 80.0–100.0)
Platelets: 200 10*3/uL (ref 150–400)
RBC: 3.98 MIL/uL (ref 3.87–5.11)
RDW: 11.5 % (ref 11.5–15.5)
WBC: 9.1 10*3/uL (ref 4.0–10.5)
nRBC: 0 % (ref 0.0–0.2)

## 2023-01-05 LAB — CBG MONITORING, ED: Glucose-Capillary: 104 mg/dL — ABNORMAL HIGH (ref 70–99)

## 2023-01-05 MED ORDER — SODIUM CHLORIDE 0.9 % IV BOLUS
500.0000 mL | Freq: Once | INTRAVENOUS | Status: AC
  Administered 2023-01-05: 500 mL via INTRAVENOUS

## 2023-01-05 NOTE — ED Notes (Signed)
Pt reports was recently dx with mono 2 weeks ago and taking prescribe meds for same, pt is able to move her lower extremities, reports legs feel week at times, pt has resistance against gravity to bilateral lower extremities and sensation intact.

## 2023-01-05 NOTE — ED Triage Notes (Signed)
Patient arrived via POV c/o weakness in legs w/ uncontrolled shaking. Patient states legs have stopped shaking, but continues to feel weak. Patient seen by FD for same earlier. Patient states taking Xanax pta. Patient is AO x 4, VS WDL, unable to walk at this time.

## 2023-01-05 NOTE — ED Notes (Signed)
Pt came to treatment room with no orders completed, labs and urine not obtained while in triage as order

## 2023-01-06 NOTE — ED Provider Notes (Signed)
EMERGENCY DEPARTMENT AT [facility] Provider Note   CSN: [id number] Arrival date & time: 01/05/23  2137     History  Chief Complaint  Patient presents with   Weakness    Kelly Stanley is a 47 y.o. female.  The history is provided by the patient.  Illness Location:  At house Quality:  Shaking legs and light headedness Severity:  Moderate Onset quality:  Sudden Timing:  Constant Progression:  Unchanged Chronicity:  New Context:  No several medications and supplements by her integrative medicine doctor including thyroid preparation Relieved by:  Nothing Associated symptoms: no chest pain, no fever, no rash, no sore throat and no wheezing   Patient with anxiety on [place] for mono infection and thyroid preparation presents with shaking legs and light headedness and feeling globally weak.  No cp, no focal neuro deficits.      Past Medical History:  Diagnosis Date   Anxiety    Syncope      Home Medications Prior to Admission medications   Medication Sig Start Date End Date Taking? Authorizing Provider  ALPRAZolam (XANAX) 0.25 MG tablet Take 1 tablet (0.25 mg total) by mouth 2 (two) times daily as needed for anxiety. 01/18/22   Copland, Gwenlyn Found, MD  desogestrel-ethinyl estradiol (VIORELE) 0.15-0.02/0.01 MG (21/5) tablet Take 1 tablet by mouth daily. 09/20/22   Olivia Mackie, NP  sertraline (ZOLOFT) 100 MG tablet TAKE 1 TABLET BY MOUTH DAILY 09/14/22   Copland, Gwenlyn Found, MD      Allergies    Codeine    Review of Systems   Review of Systems  Constitutional:  Negative for fever.  HENT:  Negative for facial swelling and sore throat.   Eyes:  Negative for redness.  Respiratory:  Negative for wheezing and stridor.   Cardiovascular:  Negative for chest pain.  Musculoskeletal:  Negative for neck pain and neck stiffness.  Skin:  Negative for rash.  Neurological:  Positive for light-headedness. Negative for weakness.  All other systems  reviewed and are negative.   Physical Exam Updated Vital Signs BP 108/66   Pulse 70   Temp 98.5 F (36.9 C) (Temporal)   Resp 16   Ht 5\' 1"  (1.549 m)   Wt 59 kg   LMP 12/31/2022 (Exact Date)   SpO2 98%   BMI 24.56 kg/m  Physical Exam Vitals and nursing note reviewed.  Constitutional:      General: She is not in acute distress.    Appearance: Normal appearance. She is well-developed.  HENT:     Head: Normocephalic and atraumatic.     Nose: Nose normal.  Eyes:     Pupils: Pupils are equal, round, and reactive to light.  Cardiovascular:     Rate and Rhythm: Normal rate and regular rhythm.     Pulses: Normal pulses.     Heart sounds: Normal heart sounds.  Pulmonary:     Effort: Pulmonary effort is normal. No respiratory distress.     Breath sounds: Normal breath sounds.  Abdominal:     General: Bowel sounds are normal. There is no distension.     Palpations: Abdomen is soft.     Tenderness: There is no abdominal tenderness. There is no guarding or rebound.  Genitourinary:    Vagina: No vaginal discharge.  Musculoskeletal:        General: Normal range of motion.     Cervical back: Normal range of motion and neck supple.  Skin:  General: Skin is warm and dry.     Capillary Refill: Capillary refill takes less than 2 seconds.     Findings: No erythema or rash.  Neurological:     General: No focal deficit present.     Mental Status: She is alert and oriented to person, place, and time.     Deep Tendon Reflexes: Reflexes normal.  Psychiatric:        Mood and Affect: Mood normal.        Behavior: Behavior normal.     ED Results / Procedures / Treatments   Labs (all labs ordered are listed, but only abnormal results are displayed) Results for orders placed or performed during the hospital encounter of 01/05/23  Basic metabolic panel  Result Value Ref Range   Sodium 137 135 - 145 mmol/L   Potassium 3.4 (L) 3.5 - 5.1 mmol/L   Chloride 105 98 - 111 mmol/L   CO2 24  22 - 32 mmol/L   Glucose, Bld 103 (H) 70 - 99 mg/dL   BUN 14 6 - 20 mg/dL   Creatinine, Ser 1.61 0.44 - 1.00 mg/dL   Calcium 8.5 (L) 8.9 - 10.3 mg/dL   GFR, Estimated >09 >60 mL/min   Anion gap 8 5 - 15  CBC  Result Value Ref Range   WBC 9.1 4.0 - 10.5 K/uL   RBC 3.98 3.87 - 5.11 MIL/uL   Hemoglobin 12.6 12.0 - 15.0 g/dL   HCT 45.4 09.8 - 11.9 %   MCV 91.2 80.0 - 100.0 fL   MCH 31.7 26.0 - 34.0 pg   MCHC 34.7 30.0 - 36.0 g/dL   RDW 14.7 82.9 - 56.2 %   Platelets 200 150 - 400 K/uL   nRBC 0.0 0.0 - 0.2 %  Urinalysis, Routine w reflex microscopic -Urine, Clean Catch  Result Value Ref Range   Color, Urine STRAW (A) YELLOW   APPearance CLEAR CLEAR   Specific Gravity, Urine <=1.005 1.005 - 1.030   pH 6.0 5.0 - 8.0   Glucose, UA NEGATIVE NEGATIVE mg/dL   Hgb urine dipstick NEGATIVE NEGATIVE   Bilirubin Urine NEGATIVE NEGATIVE   Ketones, ur NEGATIVE NEGATIVE mg/dL   Protein, ur NEGATIVE NEGATIVE mg/dL   Nitrite NEGATIVE NEGATIVE   Leukocytes,Ua NEGATIVE NEGATIVE  Pregnancy, urine  Result Value Ref Range   Preg Test, Ur NEGATIVE NEGATIVE  CBG monitoring, ED  Result Value Ref Range   Glucose-Capillary 104 (H) 70 - 99 mg/dL   CT Head Wo Contrast  Result Date: 01/05/2023 CLINICAL DATA:  Trauma EXAM: CT HEAD WITHOUT CONTRAST TECHNIQUE: Contiguous axial images were obtained from the base of the skull through the vertex without intravenous contrast. RADIATION DOSE REDUCTION: This exam was performed according to the departmental dose-optimization program which includes automated exposure control, adjustment of the mA and/or kV according to patient size and/or use of iterative reconstruction technique. COMPARISON:  CT brain 12/12/2021 FINDINGS: Brain: No evidence of acute infarction, hemorrhage, hydrocephalus, extra-axial collection or mass lesion/mass effect. Vascular: No hyperdense vessel or unexpected calcification. Skull: Normal. Negative for fracture or focal lesion. Sinuses/Orbits: No  acute finding. Other: None IMPRESSION: Negative noncontrast head CT. Electronically Signed   By: Jasmine Pang M.D.   On: 01/05/2023 23:56   MM 3D SCREENING MAMMOGRAM BILATERAL BREAST  Result Date: 01/04/2023 CLINICAL DATA:  Screening. EXAM: DIGITAL SCREENING BILATERAL MAMMOGRAM WITH TOMOSYNTHESIS AND CAD TECHNIQUE: Bilateral screening digital craniocaudal and mediolateral oblique mammograms were obtained. Bilateral screening digital breast tomosynthesis was performed. The  images were evaluated with computer-aided detection. COMPARISON:  Previous exam(s). ACR Breast Density Category d: The breasts are extremely dense, which lowers the sensitivity of mammography. FINDINGS: There are no findings suspicious for malignancy. IMPRESSION: No mammographic evidence of malignancy. A result letter of this screening mammogram will be mailed directly to the patient. RECOMMENDATION: Screening mammogram in one year. (Code:SM-B-01Y) BI-RADS CATEGORY  1: Negative. Electronically Signed   By: Frederico Hamman M.D.   On: 01/04/2023 10:46    EKG EKG Interpretation  Date/Time:  Friday Jan 05 2023 22:26:08 EDT Ventricular Rate:  56 PR Interval:  183 QRS Duration: 102 QT Interval:  454 QTC Calculation: 439 R Axis:   93 Text Interpretation: Sinus rhythm Borderline right axis deviation Confirmed by Nicanor Alcon, Gohan Collister ([id number]) on 01/05/2023 11:03:26 PM  Radiology CT Head Wo Contrast  Result Date: 01/05/2023 CLINICAL DATA:  Trauma EXAM: CT HEAD WITHOUT CONTRAST TECHNIQUE: Contiguous axial images were obtained from the base of the skull through the vertex without intravenous contrast. RADIATION DOSE REDUCTION: This exam was performed according to the departmental dose-optimization program which includes automated exposure control, adjustment of the mA and/or kV according to patient size and/or use of iterative reconstruction technique. COMPARISON:  CT brain 12/12/2021 FINDINGS: Brain: No evidence of acute infarction,  hemorrhage, hydrocephalus, extra-axial collection or mass lesion/mass effect. Vascular: No hyperdense vessel or unexpected calcification. Skull: Normal. Negative for fracture or focal lesion. Sinuses/Orbits: No acute finding. Other: None IMPRESSION: Negative noncontrast head CT. Electronically Signed   By: Jasmine Pang M.D.   On: 01/05/2023 23:56    Procedures Procedures    Medications Ordered in ED Medications  sodium chloride 0.9 % bolus 500 mL (500 mLs Intravenous New Bag/Given 01/05/23 2342)    ED Course/ Medical Decision Making/ A&P                             Medical Decision Making Patient with tremulousness and global weakness.   Amount and/or Complexity of Data Reviewed Independent Historian:     Details: Daughter see above  External Data Reviewed: notes.    Details: Previous notes reviewed  Labs: ordered.    Details: Normal sodium 137, potassium 3.4 normal creatinine .76. Urine without UTI. Normal white count 9.1 normal hemoglobin 12.6, normal platelet count  Radiology: ordered.    Details: Negative head CT by me   Risk Risk Details: Labs CT and exam are benign and reassuring.  This is not a stroke or seizures.  I am not familiar with all supplements and preparations patient is on.  I have advised that patient's recent thyroid labs were normal and patient should not be on supplementation and this may be at least part of the issue.  I am concerned these are drug interactions and adverse drug reactions.  I have advised cessation of these preparations and close follow up with PMD for ongoing care.      Final Clinical Impression(s) / ED Diagnoses Final diagnoses:  Dizziness on standing   Return for intractable cough, coughing up blood, fevers > 100.4 unrelieved by medication, shortness of breath, intractable vomiting, chest pain, shortness of breath, weakness, numbness, changes in speech, facial asymmetry, abdominal pain, passing out, Inability to tolerate liquids or food,  cough, altered mental status or any concerns. No signs of systemic illness or infection. The patient is nontoxic-appearing on exam and vital signs are within normal limits.  I have reviewed the triage vital signs and the nursing  notes. Pertinent labs & imaging results that were available during my care of the patient were reviewed by me and considered in my medical decision making (see chart for details). After history, exam, and medical workup I feel the patient has been appropriately medically screened and is safe for discharge home. Pertinent diagnoses were discussed with the patient. Patient was given return precautions.  Rx / DC Orders ED Discharge Orders     None         Oley Lahaie, MD 01/06/23 [phone]

## 2023-01-06 NOTE — ED Notes (Signed)
Pt ambulated to BR with steady gait, able to bear weight to her lower extremities, CNS intact to bilateral lower extremities. Motor strength 5 to BLE.

## 2023-01-09 ENCOUNTER — Ambulatory Visit (INDEPENDENT_AMBULATORY_CARE_PROVIDER_SITE_OTHER): Payer: Commercial Managed Care - PPO | Admitting: Family

## 2023-01-09 ENCOUNTER — Encounter: Payer: Self-pay | Admitting: Family

## 2023-01-09 VITALS — BP 126/78 | HR 90 | Ht 61.0 in | Wt 124.2 lb

## 2023-01-09 DIAGNOSIS — M791 Myalgia, unspecified site: Secondary | ICD-10-CM

## 2023-01-09 DIAGNOSIS — R5383 Other fatigue: Secondary | ICD-10-CM | POA: Diagnosis not present

## 2023-01-09 NOTE — Progress Notes (Signed)
Kelly Stanley is a 47 y.o. female with the following history as recorded in [employer]:  Patient Active Problem List   Diagnosis Date Noted   Post concussion syndrome 10/02/2014   Allergic contact dermatitis 01/26/2014   Anxiety and depression 11/17/2013   Chronic night sweats 11/17/2013   Family history of ovarian cancer 11/17/2013   Allergic rhinitis 11/17/2013   Orthostatic hypotension 11/17/2013    Current Outpatient Medications  Medication Sig Dispense Refill   ALPRAZolam (XANAX) 0.25 MG tablet Take 1 tablet (0.25 mg total) by mouth 2 (two) times daily as needed for anxiety. 20 tablet 0   desogestrel-ethinyl estradiol (VIORELE) 0.15-0.02/0.01 MG (21/5) tablet Take 1 tablet by mouth daily. 84 tablet 3   sertraline (ZOLOFT) 100 MG tablet TAKE 1 TABLET BY MOUTH DAILY 90 tablet 3   No current facility-administered medications for this visit.    Allergies: Codeine  Past Medical History:  Diagnosis Date   Anxiety    Syncope     Past Surgical History:  Procedure Laterality Date   ANTERIOR CRUCIATE LIGAMENT REPAIR Right 07/12/2022   APPENDECTOMY     KNEE ARTHROSCOPY W/ LATERAL RELEASE Left 08/22/1995    Family History  Problem Relation Age of Onset   Ovarian cancer Mother    Breast cancer Paternal Aunt 27   Ovarian cancer Maternal Grandmother    Stroke Maternal Grandfather    Diabetes Maternal Grandfather    Breast cancer Cousin 5       paternal 1st cousin    Social History   Tobacco Use   Smoking status: Never    Passive exposure: Never   Smokeless tobacco: Never  Substance Use Topics   Alcohol use: Yes    Alcohol/week: 0.0 standard drinks of alcohol    Comment: Occas    Subjective:   Accompanied by husband; has been struggling with feeling fatigued for the past 3 months; has been working with [facility] for evaluation with symptoms; notes that took a "vial" of medication to treat for Mono/ EBV and has not felt good since; went to ER last week with  dizziness/ fatigue/ feeling like she could not support her legs; did go back to [facility] yesterday for re-check and labs are still pending at this time from that visit.  Has lost 6 pounds in the past week; just doesn't feel like eating; is taking a natural thyroid replacement but initial TSH was 2.3;      Objective:  Vitals:   01/09/23 1328  BP: 126/78  Pulse: 90  SpO2: 99%  Weight: 124 lb 3.2 oz (56.3 kg)  Height: 5\' 1"  (1.549 m)    General: Well developed, well nourished, in no acute distress  Skin : Warm and dry.  Head: Normocephalic and atraumatic  Eyes: Sclera and conjunctiva clear; pupils round and reactive to light; extraocular movements intact  Ears: External normal; canals clear; tympanic membranes normal  Oropharynx: Pink, supple. No suspicious lesions  Neck: Supple without thyromegaly, adenopathy  Lungs: Respirations unlabored; clear to auscultation bilaterally without wheeze, rales, rhonchi  CVS exam: normal rate and regular rhythm.  Abdomen: Soft; nontender; nondistended; normoactive bowel sounds; no masses or hepatosplenomegaly  Musculoskeletal: No deformities; no active joint inflammation  Extremities: No edema, cyanosis, clubbing  Vessels: Symmetric bilaterally  Neurologic: Alert and oriented; speech intact; face symmetrical; moves all extremities well; CNII-XII intact without focal deficit   Assessment:  1. Other fatigue   2. Myalgia     Plan:  Concerned for reaction  to supplements that she is being prescribed by [facility]; she is asked to stop everything except her Sertraline and OCPs; will update labs today- am concerned she may be hyperthyroid due to use of external thyroid supplement; encouraged to try and drink smoothies/ milkshake; plan to see her PCP in 1 week for re-check; Anxiety component is present but am suspicious that supplements are aggravating her underlying anxiety. Am hopeful she will be back to her baseline next week and her PCP can  better re-assess.   Time spent 30 minutes  Return in about 1 week (around 01/16/2023) for with Dr. Patsy Stanley.  Orders Placed This Encounter  Procedures   CBC with Differential/Platelet   Comp Met (CMET)   Thyroid Panel With TSH   B12   Magnesium   Antinuclear Antib (ANA)   Sedimentation rate   Rheumatoid Factor    Requested Prescriptions    No prescriptions requested or ordered in this encounter

## 2023-01-09 NOTE — Patient Instructions (Signed)
Please stop all the supplements as discussed; please see Dr. [name] next week;

## 2023-01-10 LAB — CBC WITH DIFFERENTIAL/PLATELET
Basophils Absolute: 0.1 10*3/uL (ref 0.0–0.1)
Basophils Relative: 0.8 % (ref 0.0–3.0)
Eosinophils Absolute: 0 10*3/uL (ref 0.0–0.7)
Eosinophils Relative: 0.2 % (ref 0.0–5.0)
HCT: 41.9 % (ref 36.0–46.0)
Hemoglobin: 13.9 g/dL (ref 12.0–15.0)
Lymphocytes Relative: 23.2 % (ref 12.0–46.0)
Lymphs Abs: 1.9 10*3/uL (ref 0.7–4.0)
MCHC: 33.2 g/dL (ref 30.0–36.0)
MCV: 94.2 fl (ref 78.0–100.0)
Monocytes Absolute: 0.3 10*3/uL (ref 0.1–1.0)
Monocytes Relative: 3.4 % (ref 3.0–12.0)
Neutro Abs: 6.1 10*3/uL (ref 1.4–7.7)
Neutrophils Relative %: 72.4 % (ref 43.0–77.0)
Platelets: 248 10*3/uL (ref 150.0–400.0)
RBC: 4.45 Mil/uL (ref 3.87–5.11)
RDW: 12.6 % (ref 11.5–15.5)
WBC: 8.4 10*3/uL (ref 4.0–10.5)

## 2023-01-10 LAB — COMPREHENSIVE METABOLIC PANEL
ALT: 17 U/L (ref 0–35)
AST: 21 U/L (ref 0–37)
Albumin: 4.2 g/dL (ref 3.5–5.2)
Alkaline Phosphatase: 38 U/L — ABNORMAL LOW (ref 39–117)
BUN: 11 mg/dL (ref 6–23)
CO2: 26 mEq/L (ref 19–32)
Calcium: 9.5 mg/dL (ref 8.4–10.5)
Chloride: 101 mEq/L (ref 96–112)
Creatinine, Ser: 0.82 mg/dL (ref 0.40–1.20)
GFR: 86.27 mL/min (ref >60.00)
Glucose, Bld: 86 mg/dL (ref 70–99)
Potassium: 3.9 mEq/L (ref 3.5–5.1)
Sodium: 137 mEq/L (ref 135–145)
Total Bilirubin: 0.5 mg/dL (ref 0.2–1.2)
Total Protein: 6.8 g/dL (ref 6.0–8.3)

## 2023-01-10 LAB — MAGNESIUM: Magnesium: 1.9 mg/dL (ref 1.5–2.5)

## 2023-01-10 LAB — SEDIMENTATION RATE: Sed Rate: 5 mm/hr (ref 0–20)

## 2023-01-10 LAB — VITAMIN B12: Vitamin B-12: 403 pg/mL (ref 211–911)

## 2023-01-11 LAB — ANA: Anti Nuclear Antibody (ANA): POSITIVE — AB

## 2023-01-11 LAB — THYROID PANEL WITH TSH
Free Thyroxine Index: 2.4 (ref 1.4–3.8)
T3 Uptake: 26 % (ref 22–35)
T4, Total: 9.4 ug/dL (ref 5.1–11.9)
TSH: 1.55 mIU/L

## 2023-01-11 LAB — ANTI-NUCLEAR AB-TITER (ANA TITER)
ANA TITER: 1:80 {titer} — ABNORMAL HIGH
ANA Titer 1: 1:40 {titer} — ABNORMAL HIGH

## 2023-01-11 LAB — RHEUMATOID FACTOR: Rheumatoid fact SerPl-aCnc: <10 [IU]/mL

## 2023-01-12 ENCOUNTER — Encounter: Payer: Self-pay | Admitting: Family

## 2023-01-16 NOTE — Progress Notes (Addendum)
[facility] [address], [address] [place], [place] [address] [phone]  Date:  01/17/2023   Name:  Kelly Stanley   DOB:  04/30/1976   MRN:  [id number]  PCP:  Pearline Cables, MD    Chief Complaint: 1 week follow up: Myalgia, Fatigue (LM 01/09/23: Vernona Rieger advised her to hold all supplements. She would like to review labs. /Pt says she feels better. )   History of Present Illness:  Kelly Stanley is a 47 y.o. very pleasant female patient who presents with the following:  Seen today for short-term follow-up of lab abnormality Generally in good health She was seen by my partner Ria Clock, FNP on 5/21: Accompanied by husband; has been struggling with feeling fatigued for the past 3 months; has been working with [name] for evaluation with symptoms; notes that took a "vial" of medication to treat for Mono/ EBV and has not felt good since; went to ER last week with dizziness/ fatigue/ feeling like she could not support her legs; did go back to [facility] yesterday for re-check and labs are still pending at this time from that visit. Has lost 6 pounds in the past week; just doesn't feel like eating; is taking a natural thyroid replacement but initial TSH was 2.3  Vernona Rieger got some lab work, CMP normal B12 normal, CBC normal, Sed rate 5 TSH 1.5,T4 and T3 normal ANA was weakly positive, 1:40 and also 1:80, 2 different patterns Rheumatoid factor negative  ANA Titer 1 titer 1:40 High   Comment: A low level ANA titer may be present in pre-clinical autoimmune diseases and normal individuals.                 Reference Range                 <1:40        Negative                 1:40-1:80    Low Antibody Level                 >1:80        Elevated Antibody Level .  ANA Pattern 1 Nuclear, Homogeneous Abnormal   Comment: Homogeneous pattern is associated with systemic lupus erythematosus (SLE), drug-induced lupus and  juvenile idiopathic arthritis. . AC-1: Homogeneous International Consensus on ANA Patterns ([email or web address])  ANA TITER titer 1:80 High   Comment: A low level ANA titer may be present in pre-clinical autoimmune diseases and normal individuals.                 Reference Range                 <1:40        Negative                 1:40-1:80    Low Antibody Level                 >1:80        Elevated Antibody Level .  ANA PATTERN Nuclear, Speckled Abnormal   Comment: Speckled pattern is associated with mixed connective tissue disease (MCTD), systemic lupus erythematosus (SLE), Sjogren's syndrome, dermatomyositis, and systemic sclerosis/polymyositis overlap. .   The [employer] of Rheumatology (ACR) has devised certain classification criteria, and four or more of these criteria must be present for a classification of lupus. [The term "classification"  is not synonymous with "diagnosis." "Classification" means that reasonable certainty exists for the diagnosis of lupus for research purposes.] Although, these criteria are currently being updated, they are believed to be about 90% effective. The ACR criteria include malar rash; discoid rash; photosensitivity (development of a rash after sun exposure); oral or nasal ulcers; arthritis of multiple joints; serositis: (inflammation of the lining around the lungs or heart); kidney disease indicated by protein or casts in the urine; neurological disorders such as seizures and psychosis; and blood disorders such as hemolytic anemia, leukopenia, and lymphopenia.  She is taking some omega 3, magnesium and tumeric only for now  She would like to go back on her probiotics which I think is ok   No rashes to suggest lupus No oral or nasal ulcers No particular joint pain except for her right knee but she did have knee surgery less than a year ago    Wt Readings from Last 3 Encounters:  01/17/23 124 lb 12.8 oz (56.6 kg)   01/09/23 124 lb 3.2 oz (56.3 kg)  01/05/23 130 lb (59 kg)   Pt notes her "normal" weight should be about 130-she notes she has not been as hungry recently She had knee surgery last November and did lose some muscle mass during this interim as she could not do as much  She notes she is eating pretty well again as of the last week or so She has not had the severe leg shaking which took her to the ER  She felt like there was a tender node under her right arm- she just did her mammo 2 weeks ago which was normal Patient Active Problem List   Diagnosis Date Noted   Post concussion syndrome 10/02/2014   Allergic contact dermatitis 01/26/2014   Anxiety and depression 11/17/2013   Chronic night sweats 11/17/2013   Family history of ovarian cancer 11/17/2013   Allergic rhinitis 11/17/2013   Orthostatic hypotension 11/17/2013    Past Medical History:  Diagnosis Date   Anxiety    Syncope     Past Surgical History:  Procedure Laterality Date   ANTERIOR CRUCIATE LIGAMENT REPAIR Right 07/12/2022   APPENDECTOMY     KNEE ARTHROSCOPY W/ LATERAL RELEASE Left 08/22/1995    Social History   Tobacco Use   Smoking status: Never    Passive exposure: Never   Smokeless tobacco: Never  Vaping Use   Vaping Use: Never used  Substance Use Topics   Alcohol use: Yes    Alcohol/week: 0.0 standard drinks of alcohol    Comment: Occas   Drug use: No    Family History  Problem Relation Age of Onset   Ovarian cancer Mother    Breast cancer Paternal Aunt 71   Ovarian cancer Maternal Grandmother    Stroke Maternal Grandfather    Diabetes Maternal Grandfather    Breast cancer Cousin 21       paternal 1st cousin    Allergies  Allergen Reactions   Codeine Nausea And Vomiting    Medication list has been reviewed and updated.  Current Outpatient Medications on File Prior to Visit  Medication Sig Dispense Refill   ALPRAZolam (XANAX) 0.25 MG tablet Take 1 tablet (0.25 mg total) by mouth 2  (two) times daily as needed for anxiety. 20 tablet 0   desogestrel-ethinyl estradiol (VIORELE) 0.15-0.02/0.01 MG (21/5) tablet Take 1 tablet by mouth daily. 84 tablet 3   sertraline (ZOLOFT) 100 MG tablet TAKE 1 TABLET BY MOUTH DAILY 90 tablet 3  No current facility-administered medications on file prior to visit.    Review of Systems:  As per HPI- otherwise negative. BP Readings from Last 3 Encounters:  01/17/23 120/72  01/09/23 126/78  01/06/23 110/73     Physical Examination: Vitals:   01/17/23 1044  BP: 120/72  Pulse: 86  Resp: 18  Temp: 97.7 F (36.5 C)  SpO2: 99%   Vitals:   01/17/23 1044  Weight: 124 lb 12.8 oz (56.6 kg)  Height: 5' 3.5" (1.613 m)   Body mass index is 21.76 kg/m. Ideal Body Weight: Weight in (lb) to have BMI = 25: 143.1  GEN: no acute distress.  Normal weight, slender build.  Looks well HEENT: Atraumatic, Normocephalic.  Ears and Nose: No external deformity. CV: RRR, No M/G/R. No JVD. No thrill. No extra heart sounds. PULM: CTA B, no wheezes, crackles, rhonchi. No retractions. No resp. distress. No accessory muscle use. ABD: S, NT, ND, +BS. No rebound. No HSM. EXTR: No c/c/e PSYCH: Normally interactive. Conversant.  Right axilla reveals no abnormal lymphadenopathy  Assessment and Plan: Menstrual abnormality - Plan: FSH  Positive ANA (antinuclear antibody) - Plan: Anti-DNA antibody, double-stranded, Anti-Smith antibody, Sjogrens syndrome-A extractable nuclear antibody, Sjogrens syndrome-B extractable nuclear antibody, Urine Microscopic Only, C-reactive protein  Patient seen today with concern of nonspecific fatigue and malaise.  She went to an integrative medicine clinic and was given some sort of treatment for Epstein-Barr, which may have triggered an allergic reaction.  She was seen in the emergency room on 5/17 following an episode of severe shaking and weakness.  She has not had such severe symptoms again She was seen by my partner Ria Clock about a week ago, at that time she was noted with a positive ANA.  Given her other unusual symptoms we will do more workup in search of any indication of an autoimmune disorder  We will also check an FSH to determine if she is entering menopause   Signed Abbe Amsterdam, MD  Received labs as below so far, message to patient Received updated labs 5/31- message to pt   Results for orders placed or performed in visit on 01/17/23  Anti-DNA antibody, double-stranded  Result Value Ref Range   ds DNA Ab <1 IU/mL  Anti-Smith antibody  Result Value Ref Range   ENA SM Ab Ser-aCnc <1.0 NEG <1.0 NEG AI  Sjogrens syndrome-A extractable nuclear antibody  Result Value Ref Range   SSA (Ro) (ENA) Antibody, IgG <1.0 NEG <1.0 NEG AI  Sjogrens syndrome-B extractable nuclear antibody  Result Value Ref Range   SSB (La) (ENA) Antibody, IgG <1.0 NEG <1.0 NEG AI  Urine Microscopic Only  Result Value Ref Range   WBC, UA 0-2/hpf 0-2/hpf   RBC / HPF 0-2/hpf 0-2/hpf   Mucus, UA Presence of (A) None   Squamous Epithelial / HPF Rare(0-4/hpf) Rare(0-4/hpf)  FSH  Result Value Ref Range   FSH 5.0 mIU/ML  C-reactive protein  Result Value Ref Range   CRP <1.0 0.5 - 20.0 mg/dL

## 2023-01-17 ENCOUNTER — Ambulatory Visit (INDEPENDENT_AMBULATORY_CARE_PROVIDER_SITE_OTHER): Payer: Commercial Managed Care - PPO | Admitting: Family Medicine

## 2023-01-17 ENCOUNTER — Encounter: Payer: Self-pay | Admitting: Family Medicine

## 2023-01-17 VITALS — BP 120/72 | HR 86 | Temp 97.7°F | Resp 18 | Ht 63.5 in | Wt 124.8 lb

## 2023-01-17 DIAGNOSIS — R768 Other specified abnormal immunological findings in serum: Secondary | ICD-10-CM | POA: Diagnosis not present

## 2023-01-17 DIAGNOSIS — N926 Irregular menstruation, unspecified: Secondary | ICD-10-CM | POA: Diagnosis not present

## 2023-01-17 LAB — FOLLICLE STIMULATING HORMONE: FSH: 5 m[IU]/mL

## 2023-01-17 LAB — URINALYSIS, MICROSCOPIC ONLY

## 2023-01-17 LAB — C-REACTIVE PROTEIN: CRP: <1 mg/dL (ref 0.5–20.0)

## 2023-01-17 NOTE — Patient Instructions (Signed)
It was good to see you today!   I will be in touch with your labs asap In the meantime would concentrate on eating well, try to sleep/ rest as needed and minimize supplement use Please let me know if anything changes or gets worse!

## 2023-01-18 LAB — SJOGRENS SYNDROME-B EXTRACTABLE NUCLEAR ANTIBODY: SSB (La) (ENA) Antibody, IgG: <1 AI

## 2023-01-18 LAB — ANTI-DNA ANTIBODY, DOUBLE-STRANDED: ds DNA Ab: <1 IU/mL

## 2023-01-18 LAB — SJOGRENS SYNDROME-A EXTRACTABLE NUCLEAR ANTIBODY: SSA (Ro) (ENA) Antibody, IgG: <1 AI

## 2023-01-18 LAB — ANTI-SMITH ANTIBODY: ENA SM Ab Ser-aCnc: <1 AI

## 2023-01-19 ENCOUNTER — Encounter: Payer: Self-pay | Admitting: Family Medicine

## 2023-01-23 ENCOUNTER — Other Ambulatory Visit: Payer: Self-pay | Admitting: Family Medicine

## 2023-01-23 DIAGNOSIS — F40243 Fear of flying: Secondary | ICD-10-CM

## 2023-02-08 ENCOUNTER — Ambulatory Visit: Payer: Commercial Managed Care - PPO | Admitting: Family Medicine

## 2023-02-10 NOTE — Progress Notes (Unsigned)
[facility] [address], [address] [place], [place] [address] [phone] [id number]  Date:  02/14/2023   Name:  Kelly Stanley   DOB:  [birth year]   MRN:  [id number]  PCP:  Kelly Cables, MD    Chief Complaint: No chief complaint on file.   History of Present Illness:  Kelly Stanley is a 47 y.o. very pleasant female patient who presents with the following:  Pt seen today with concern of stomach pain. She is generally in good health  Last seen by myself at the end of May: Patient seen today with concern of nonspecific fatigue and malaise.  She went to an integrative medicine clinic and was given some sort of treatment for Epstein-Barr, which may have triggered an allergic reaction.  She was seen in the emergency room on 5/17 following an episode of severe shaking and weakness.  She has not had such severe symptoms again She was seen by my partner Kelly Stanley about a week ago, at that time she was noted with a positive ANA.  Given her other unusual symptoms we will do more workup in search of any indication of an autoimmune disorder We will also check an FSH to determine if she is entering menopause  Her lab work from last month was overall benign      Patient Active Problem List   Diagnosis Date Noted   Post concussion syndrome 10/02/2014   Allergic contact dermatitis 01/26/2014   Anxiety and depression 11/17/2013   Chronic night sweats 11/17/2013   Family history of ovarian cancer 11/17/2013   Allergic rhinitis 11/17/2013   Orthostatic hypotension 11/17/2013    Past Medical History:  Diagnosis Date   Anxiety    Syncope     Past Surgical History:  Procedure Laterality Date   ANTERIOR CRUCIATE LIGAMENT REPAIR Right 07/12/2022   APPENDECTOMY     KNEE ARTHROSCOPY W/ LATERAL RELEASE Left 08/22/1995    Social History   Tobacco Use   Smoking status: Never    Passive exposure: Never   Smokeless tobacco: Never   Vaping Use   Vaping Use: Never used  Substance Use Topics   Alcohol use: Yes    Alcohol/week: 0.0 standard drinks of alcohol    Comment: Occas   Drug use: No    Family History  Problem Relation Age of Onset   Ovarian cancer Mother    Breast cancer Paternal Aunt 8   Ovarian cancer Maternal Grandmother    Stroke Maternal Grandfather    Diabetes Maternal Grandfather    Breast cancer Cousin 60       paternal 1st cousin    Allergies  Allergen Reactions   Codeine Nausea And Vomiting    Medication list has been reviewed and updated.  Current Outpatient Medications on File Prior to Visit  Medication Sig Dispense Refill   ALPRAZolam (XANAX) 0.25 MG tablet TAKE 1 TABLET(0.25 MG) BY MOUTH TWICE DAILY AS NEEDED FOR ANXIETY 20 tablet 1   desogestrel-ethinyl estradiol (VIORELE) 0.15-0.02/0.01 MG (21/5) tablet Take 1 tablet by mouth daily. 84 tablet 3   ibuprofen (ADVIL) 600 MG tablet Take by mouth.     NP THYROID 15 MG tablet Take 15 mg by mouth every morning.     sertraline (ZOLOFT) 100 MG tablet TAKE 1 TABLET BY MOUTH DAILY 90 tablet 3   valACYclovir (VALTREX) 1000 MG tablet Take 1,000 mg by mouth 2 (two) times daily.  No current facility-administered medications on file prior to visit.    Review of Systems:  As per HPI- otherwise negative.   Physical Examination: There were no vitals filed for this visit. There were no vitals filed for this visit. There is no height or weight on file to calculate BMI. Ideal Body Weight:    GEN: no acute distress. HEENT: Atraumatic, Normocephalic.  Ears and Nose: No external deformity. CV: RRR, No M/G/R. No JVD. No thrill. No extra heart sounds. PULM: CTA B, no wheezes, crackles, rhonchi. No retractions. No resp. distress. No accessory muscle use. ABD: S, NT, ND, +BS. No rebound. No HSM. EXTR: No c/c/e PSYCH: Normally interactive. Conversant.    Assessment and Plan: ***  Signed Lamar Blinks, MD

## 2023-02-14 ENCOUNTER — Encounter: Payer: Self-pay | Admitting: Family Medicine

## 2023-02-14 ENCOUNTER — Ambulatory Visit (INDEPENDENT_AMBULATORY_CARE_PROVIDER_SITE_OTHER): Payer: Commercial Managed Care - PPO | Admitting: Family Medicine

## 2023-02-14 VITALS — BP 118/64 | HR 74 | Ht 63.5 in | Wt 125.2 lb

## 2023-02-14 DIAGNOSIS — R1032 Left lower quadrant pain: Secondary | ICD-10-CM | POA: Diagnosis not present

## 2023-02-14 DIAGNOSIS — F32A Depression, unspecified: Secondary | ICD-10-CM

## 2023-02-14 DIAGNOSIS — R768 Other specified abnormal immunological findings in serum: Secondary | ICD-10-CM | POA: Diagnosis not present

## 2023-02-14 DIAGNOSIS — M791 Myalgia, unspecified site: Secondary | ICD-10-CM | POA: Diagnosis not present

## 2023-02-14 DIAGNOSIS — F419 Anxiety disorder, unspecified: Secondary | ICD-10-CM

## 2023-03-19 ENCOUNTER — Encounter: Payer: Self-pay | Admitting: *Deleted

## 2023-03-19 ENCOUNTER — Other Ambulatory Visit: Payer: Self-pay | Admitting: *Deleted

## 2023-03-19 DIAGNOSIS — M545 Low back pain, unspecified: Secondary | ICD-10-CM

## 2023-04-06 ENCOUNTER — Other Ambulatory Visit: Payer: Commercial Managed Care - PPO

## 2023-04-08 ENCOUNTER — Ambulatory Visit
Admission: RE | Admit: 2023-04-08 | Discharge: 2023-04-08 | Disposition: A | Discharge status: Home / Self Care | Payer: Commercial Managed Care - PPO | Source: Ambulatory Visit | Attending: *Deleted | Admitting: *Deleted

## 2023-04-08 DIAGNOSIS — M545 Low back pain, unspecified: Secondary | ICD-10-CM

## 2023-07-17 ENCOUNTER — Encounter: Payer: Self-pay | Admitting: Medical

## 2023-07-17 ENCOUNTER — Telehealth: Payer: Self-pay | Admitting: Family Medicine

## 2023-07-17 ENCOUNTER — Ambulatory Visit (INDEPENDENT_AMBULATORY_CARE_PROVIDER_SITE_OTHER): Payer: Commercial Managed Care - PPO | Admitting: Medical

## 2023-07-17 VITALS — BP 131/72 | HR 75 | Temp 98.0°F | Ht 63.5 in | Wt 120.2 lb

## 2023-07-17 DIAGNOSIS — R5383 Other fatigue: Secondary | ICD-10-CM | POA: Diagnosis not present

## 2023-07-17 DIAGNOSIS — F32A Depression, unspecified: Secondary | ICD-10-CM

## 2023-07-17 DIAGNOSIS — J309 Allergic rhinitis, unspecified: Secondary | ICD-10-CM | POA: Diagnosis not present

## 2023-07-17 DIAGNOSIS — R739 Hyperglycemia, unspecified: Secondary | ICD-10-CM | POA: Diagnosis not present

## 2023-07-17 DIAGNOSIS — R42 Dizziness and giddiness: Secondary | ICD-10-CM | POA: Diagnosis not present

## 2023-07-17 DIAGNOSIS — H6992 Unspecified Eustachian tube disorder, left ear: Secondary | ICD-10-CM

## 2023-07-17 DIAGNOSIS — F419 Anxiety disorder, unspecified: Secondary | ICD-10-CM | POA: Diagnosis not present

## 2023-07-17 NOTE — Telephone Encounter (Signed)
FYI: This call has been transferred to triage nurse: the Triage Nurse. Once the result note has been entered staff can address the message at that time.  Patient called in with the following symptoms:  Red Word: "Not Feeling Great", Dizziness, Almost Passed out, Shakiness   Please advise at Mobile [phone] (mobile)  Message is routed to Provider Pool.

## 2023-07-17 NOTE — Telephone Encounter (Signed)
Initial Comment Patient is experiencing dizziness and it could be from her anxiety, needs an appt. Translation No Nurse Assessment Nurse: Mayford Knife, RN, Whitney Date/Time (Eastern Time): 07/17/2023 11:12:34 AM Confirm and document reason for call. If symptomatic, describe symptoms. ---Caller states she is having some dizziness, could be from anxiety, but not sure, would want to be seen. Symptoms have been present for 3-4 weeks, intermittently. states right now she feels ok, just a little anxious talking about it, but not feelin dizzy right now. Does the patient have any new or worsening symptoms? ---Yes Will a triage be completed? ---Yes Related visit to physician within the last 2 weeks? ---No Does the PT have any chronic conditions? (i.e. diabetes, asthma, this includes High risk factors for pregnancy, etc.) ---Yes List chronic conditions. ---anxiety Is the patient pregnant or possibly pregnant? (Ask all females between the ages of 58-55) ---No Is this a behavioral health or substance abuse call? ---No Guidelines Guideline Title Affirmed Question Affirmed Notes Nurse Date/Time (Eastern Time) Anxiety and Panic Attack MODERATE anxiety (e.g., persistent or frequent anxiety symptoms; interferes with sleep, school, or work) Mayford Knife, Charity fundraiser, [facility] 07/17/2023 11:14:35 AM PLEASE NOTE: All timestamps contained within this report are represented as [place] Standard Time. CONFIDENTIALTY NOTICE: This fax transmission is intended only for the addressee. It contains information that is legally privileged, confidential or otherwise protected from use or disclosure. If you are not the intended recipient, you are strictly prohibited from reviewing, disclosing, copying using or disseminating any of this information or taking any action in reliance on or regarding this information. If you have received this fax in error, please notify us immediately by telephone so that we can arrange for its  return to [place]. Phone: [phone], Toll-Free: [phone], Fax: [phone] Page: 2 of 2 Call Id: [id number] Disp. Time Lamount Cohen Time) Disposition Final User 07/17/2023 11:20:17 AM SEE PCP WITHIN 3 DAYS Yes Mayford Knife, RN, Alphonzo Lemmings Final Disposition 07/17/2023 11:20:17 AM SEE PCP WITHIN 3 DAYS Yes Mayford Knife, RN, Whitney Caller Disagree/Comply Comply Caller Understands Yes PreDisposition InappropriateToAsk Care Advice Given Per Guideline SEE PCP WITHIN 3 DAYS: * You need to be seen within 2 or 3 days. CALL BACK IF: * You become worse Comments User: Hardie Lora, RN Date/Time Lamount Cohen Time): 07/17/2023 11:16:59 AM zoloft, xanax Referrals REFERRED TO PCP OFFICE

## 2023-07-17 NOTE — Progress Notes (Signed)
Subjective:    Patient ID: Kelly Stanley, female    DOB: [birth year], 47 y.o.   MRN: [id number]  HPI  Discussed the use of AI scribe software for clinical note transcription with the patient, who gave verbal consent to proceed.  History of Present Illness   The patient, with a history of anxiety and depression, presents with a chief complaint of dizziness and a sensation of pressure in the head, akin to the feeling experienced during airplane takeoff. These symptoms have been ongoing for approximately three to four weeks. The patient also reports frequent urination and increased thirst, which she attributes to anxiety, but also expresses concern about the possibility of diabetes.  The patient's dizziness has been a recurrent issue, but the sensation of pressure is a new symptom. The patient is unsure whether the dizziness triggers her anxiety or vice versa. She has been managing her anxiety with Xanax, taken as needed, and Zoloft, which she recently self-adjusted from 100mg  to 50mg . The patient admits that she may have been less anxious on the higher dose of Zoloft.  The patient also reports a recent episode of congestion and ear pain, for which she sought care at an urgent care center. The provider at the urgent care center diagnosed her with blocked ears and possible fluid accumulation, but no ear infection. Despite taking decongestants, the patient continued to experience symptoms. A school nurse subsequently examined the patient's ears and found them to be normal.  The patient has also been experiencing fatigue and unintentional weight loss of approximately five to six pounds over a few days. She attributes the weight loss to a decreased appetite and increased urination. The patient also mentions a history of allergies and a recent family illness involving congestion. She has been using Flonase nasal spray at home to manage her symptoms.        Review of Systems  Constitutional:   Positive for fatigue. Negative for chills and fever.  HENT:  Positive for congestion. Negative for drooling.        Left ear pressure.  Respiratory:  Negative for cough, chest tightness and wheezing.   Cardiovascular:  Negative for chest pain and palpitations.  Gastrointestinal:  Negative for abdominal pain, blood in stool, diarrhea and nausea.  Genitourinary:  Negative for dysuria.  Musculoskeletal:  Negative for back pain and myalgias.  Skin:  Negative for rash.  Neurological:  Positive for dizziness. Negative for syncope, speech difficulty, weakness and light-headedness.  Hematological:  Negative for adenopathy. Does not bruise/bleed easily.  Psychiatric/Behavioral:  Positive for dysphoric mood. Negative for confusion and suicidal ideas. The patient is nervous/anxious.     Past Medical History:  Diagnosis Date   Anxiety    Syncope      Social History   Socioeconomic History   Marital status: Married    Spouse name: Not on file   Number of children: Not on file   Years of education: Not on file   Highest education level: Master's degree (e.g., MA, MS, MEng, MEd, MSW, MBA)  Occupational History   Not on file  Tobacco Use   Smoking status: Never    Passive exposure: Never   Smokeless tobacco: Never  Vaping Use   Vaping status: Never Used  Substance and Sexual Activity   Alcohol use: Yes    Alcohol/week: 0.0 standard drinks of alcohol    Comment: Occas   Drug use: No   Sexual activity: Yes    Partners: Male    Birth  control/protection: Pill    Comment: 1st intercourse 47 yo-Fewer than 5 partners, menarche 15yo  Other Topics Concern   Not on file  Social History Narrative   Not on file   Social Determinants of Health   Financial Resource Strain: Low Risk  (01/16/2023)   Overall Financial Resource Strain (CARDIA)    Difficulty of Paying Living Expenses: Not hard at all  Food Insecurity: No Food Insecurity (01/16/2023)   Hunger Vital Sign    Worried About Running  Out of Food in the Last Year: Never true    Ran Out of Food in the Last Year: Never true  Transportation Needs: No Transportation Needs (01/16/2023)   PRAPARE - Administrator, Civil Service (Medical): No    Lack of Transportation (Non-Medical): No  Physical Activity: Sufficiently Active (01/16/2023)   Exercise Vital Sign    Days of Exercise per Week: 4 days    Minutes of Exercise per Session: 50 min  Stress: Stress Concern Present (01/16/2023)   [employer] of Occupational Health - Occupational Stress Questionnaire    Feeling of Stress : To some extent  Social Connections: Socially Integrated (01/16/2023)   Social Connection and Isolation Panel [NHANES]    Frequency of Communication with Friends and Family: More than three times a week    Frequency of Social Gatherings with Friends and Family: Three times a week    Attends Religious Services: More than 4 times per year    Active Member of Clubs or Organizations: Yes    Attends Engineer, structural: More than 4 times per year    Marital Status: Married  Catering manager Violence: Not on file    Past Surgical History:  Procedure Laterality Date   ANTERIOR CRUCIATE LIGAMENT REPAIR Right 07/12/2022   APPENDECTOMY     KNEE ARTHROSCOPY W/ LATERAL RELEASE Left 08/22/1995    Family History  Problem Relation Age of Onset   Ovarian cancer Mother    Breast cancer Paternal Aunt 81   Ovarian cancer Maternal Grandmother    Stroke Maternal Grandfather    Diabetes Maternal Grandfather    Breast cancer Cousin 61       paternal 1st cousin    Allergies  Allergen Reactions   Codeine Nausea And Vomiting    Current Outpatient Medications on File Prior to Visit  Medication Sig Dispense Refill   ALPRAZolam (XANAX) 0.25 MG tablet TAKE 1 TABLET(0.25 MG) BY MOUTH TWICE DAILY AS NEEDED FOR ANXIETY 20 tablet 1   ibuprofen (ADVIL) 600 MG tablet Take by mouth. (Patient not taking: Reported on 02/14/2023)     sertraline  (ZOLOFT) 100 MG tablet TAKE 1 TABLET BY MOUTH DAILY (Patient taking differently: Take 50 mg by mouth daily. TAKE 1 TABLET BY MOUTH DAILY) 90 tablet 3   No current facility-administered medications on file prior to visit.    BP 131/72   Pulse 75   Temp 98 F (36.7 C)   Ht 5' 3.5" (1.613 m)   Wt 120 lb 3.2 oz (54.5 kg)   SpO2 100%   BMI 20.96 kg/m        Objective:   Physical Exam  General Mental Status- Alert. General Appearance- Not in acute distress.   Skin General: Color- Normal Color. Moisture- Normal Moisture.  Neck Carotid Arteries- Normal color. Moisture- Normal Moisture. No carotid bruits. No JVD.  Chest and Lung Exam Auscultation: Breath Sounds:-Normal.  Cardiovascular Auscultation:Rythm- Regular. Murmurs & Other Heart Sounds:Auscultation of the heart reveals- No  Murmurs.  Abdomen Inspection:-Inspeection Normal. Palpation/Percussion:Note:No mass. Palpation and Percussion of the abdomen reveal- Non Tender, Non Distended + BS, no rebound or guarding.    Neurologic Cranial Nerve exam:- CN III-XII intact(No nystagmus), symmetric smile. Drift Test:- No drift. Romberg Exam:- Negative.  Heal to Toe Gait exam:-Normal. Finger to Nose:- Normal/Intact Strength:- 5/5 equal and symmetric strength both upper and lower extremities.       Assessment & Plan:   Assessment and Plan    Anxiety and Depression High scores on PHQ-9 and GAD-7. Recent self-reduction of Zoloft from 100mg  to 50mg . Occasional use of Xanax 0.25mg  for acute anxiety symptoms. -Increase Zoloft back to 100mg  daily. -Continue Xanax 0.25mg  as needed. -Schedule follow-up with Dr. Dallas Schimke in 2-3 weeks.  Dizziness and Ear Pressure Onset 3-4 weeks ago, initially associated with allergy symptoms. Previous diagnosis of inner ear canal issue at urgent care. Symptoms described as transient waves of pressure, similar to airplane takeoff. Neurologic exam normal. -Continue Flonase nasal spray for  potential eustachian tube dysfunction. -Order CBC and metabolic panel for dizziness. -i don't think ct imaging indicated. If signs/symptoms change or worsen as discussed then be seen in ED  Weight Loss and Increased Urination Recent unintentional weight loss of 5-6 pounds and increased urination but no uti symptoms on review(also you admit drinking alot of water). Previous slightly elevated blood sugar. -Order A1-C, TSH, T4, B12, B1, iron level to evaluate for potential causes of weight loss and fatigue.  Follow-up -Review lab results via MyChart. -Schedule follow-up appointment with Dr. Dallas Schimke in 2-3 weeks.   Esperanza Richters, PA-C

## 2023-07-17 NOTE — Telephone Encounter (Signed)
Appt scheduled w/ Ramon Dredge today.

## 2023-07-17 NOTE — Patient Instructions (Signed)
Anxiety and Depression High scores on PHQ-9 and GAD-7. Recent self-reduction of Zoloft from 100mg  to 50mg . Occasional use of Xanax 0.25mg  for acute anxiety symptoms. -Increase Zoloft back to 100mg  daily. -Continue Xanax 0.25mg  as needed. -Schedule follow-up with Dr. Dallas Schimke in 2-3 weeks.  Dizziness and Ear Pressure Onset 3-4 weeks ago, initially associated with allergy symptoms. Previous diagnosis of inner ear canal issue at urgent care. Symptoms described as transient waves of pressure, similar to airplane takeoff. Neurologic exam normal. -Continue Flonase nasal spray for potential eustachian tube dysfunction. -Order CBC and metabolic panel for dizziness. -i don't think ct imaging indicated. If signs/symptoms change or worsen as discussed then be seen in ED  Weight Loss and Increased Urination Recent unintentional weight loss of 5-6 pounds and increased urination but no uti symptoms on review(also you admit drinking alot of water). Previous slightly elevated blood sugar. -Order A1-C, TSH, T4, B12, B1, iron level to evaluate for potential causes of weight loss and fatigue.  Follow-up -Review lab results via MyChart. -Schedule follow-up appointment with Dr. Dallas Schimke in 2-3 weeks.

## 2023-07-18 LAB — COMPREHENSIVE METABOLIC PANEL
ALT: 22 U/L (ref 0–35)
AST: 24 U/L (ref 0–37)
Albumin: 5 g/dL (ref 3.5–5.2)
Alkaline Phosphatase: 50 U/L (ref 39–117)
BUN: 14 mg/dL (ref 6–23)
CO2: 28 meq/L (ref 19–32)
Calcium: 9.9 mg/dL (ref 8.4–10.5)
Chloride: 102 meq/L (ref 96–112)
Creatinine, Ser: 0.79 mg/dL (ref 0.40–1.20)
GFR: 89.89 mL/min (ref >60.00)
Glucose, Bld: 90 mg/dL (ref 70–99)
Potassium: 4 meq/L (ref 3.5–5.1)
Sodium: 139 meq/L (ref 135–145)
Total Bilirubin: 0.6 mg/dL (ref 0.2–1.2)
Total Protein: 7.5 g/dL (ref 6.0–8.3)

## 2023-07-18 LAB — HEMOGLOBIN A1C: Hgb A1c MFr Bld: 5.4 % (ref 4.6–6.5)

## 2023-07-18 LAB — CBC WITH DIFFERENTIAL/PLATELET
Basophils Absolute: 0 10*3/uL (ref 0.0–0.1)
Basophils Relative: 0.2 % (ref 0.0–3.0)
Eosinophils Absolute: 0 10*3/uL (ref 0.0–0.7)
Eosinophils Relative: 0.4 % (ref 0.0–5.0)
HCT: 41.3 % (ref 36.0–46.0)
Hemoglobin: 14.3 g/dL (ref 12.0–15.0)
Lymphocytes Relative: 29.1 % (ref 12.0–46.0)
Lymphs Abs: 2.5 10*3/uL (ref 0.7–4.0)
MCHC: 34.5 g/dL (ref 30.0–36.0)
MCV: 94.7 fL (ref 78.0–100.0)
Monocytes Absolute: 0.5 10*3/uL (ref 0.1–1.0)
Monocytes Relative: 6 % (ref 3.0–12.0)
Neutro Abs: 5.5 10*3/uL (ref 1.4–7.7)
Neutrophils Relative %: 64.3 % (ref 43.0–77.0)
Platelets: 273 10*3/uL (ref 150.0–400.0)
RBC: 4.36 Mil/uL (ref 3.87–5.11)
RDW: 12.1 % (ref 11.5–15.5)
WBC: 8.5 10*3/uL (ref 4.0–10.5)

## 2023-07-18 LAB — TSH: TSH: 1.52 u[IU]/mL (ref 0.35–5.50)

## 2023-07-18 LAB — VITAMIN B12: Vitamin B-12: 922 pg/mL — ABNORMAL HIGH (ref 211–911)

## 2023-07-18 LAB — IRON: Iron: 122 ug/dL (ref 42–145)

## 2023-07-18 LAB — T4, FREE: Free T4: 0.8 ng/dL (ref 0.60–1.60)

## 2023-07-21 LAB — VITAMIN B1: Vitamin B1 (Thiamine): 16 nmol/L (ref 8–30)

## 2023-09-07 ENCOUNTER — Telehealth (INDEPENDENT_AMBULATORY_CARE_PROVIDER_SITE_OTHER): Payer: Self-pay | Admitting: Otolaryngology

## 2023-09-07 NOTE — Telephone Encounter (Signed)
LVM to confirm appt & location [id number] afm

## 2023-09-10 ENCOUNTER — Encounter (INDEPENDENT_AMBULATORY_CARE_PROVIDER_SITE_OTHER): Payer: Self-pay

## 2023-09-10 ENCOUNTER — Ambulatory Visit (INDEPENDENT_AMBULATORY_CARE_PROVIDER_SITE_OTHER): Payer: Commercial Managed Care - PPO | Admitting: Otolaryngology

## 2023-09-10 ENCOUNTER — Ambulatory Visit (INDEPENDENT_AMBULATORY_CARE_PROVIDER_SITE_OTHER): Payer: Commercial Managed Care - PPO | Admitting: Audiology

## 2023-09-10 VITALS — BP 111/72 | HR 62 | Ht 64.0 in | Wt 122.0 lb

## 2023-09-10 DIAGNOSIS — R42 Dizziness and giddiness: Secondary | ICD-10-CM | POA: Diagnosis not present

## 2023-09-10 DIAGNOSIS — H903 Sensorineural hearing loss, bilateral: Secondary | ICD-10-CM

## 2023-09-10 DIAGNOSIS — H9312 Tinnitus, left ear: Secondary | ICD-10-CM | POA: Diagnosis not present

## 2023-09-11 DIAGNOSIS — R42 Dizziness and giddiness: Secondary | ICD-10-CM | POA: Insufficient documentation

## 2023-09-11 DIAGNOSIS — H9312 Tinnitus, left ear: Secondary | ICD-10-CM | POA: Insufficient documentation

## 2023-09-11 NOTE — Progress Notes (Signed)
Patient ID: Kelly Stanley, female   DOB: 08/02/1976, 48 y.o.   MRN: [id number]  CC: Recurrent dizziness, left ear tinnitus  HPI:  Kelly Stanley is a 48 y.o. female who presents today complaining of recurrent dizziness for the past 3 months.  She describes her dizziness as an off-balance and lightheaded sensation.  Each episode typically lasts for minutes to hours.  She denies any spinning vertigo.  In addition, the patient also complains of concurrent left ear tinnitus and left ear pressure during her dizziness episodes.  The patient denies any significant hearing difficulty, otalgia, or otorrhea.  She has no recent otitis media or otitis externa.  She has no previous ENT surgery.  She had an episode of upper respiratory infection in October 2024.  She has a history of anxiety, and is currently on Zoloft.  Past Medical History:  Diagnosis Date   Anxiety    Syncope     Past Surgical History:  Procedure Laterality Date   ANTERIOR CRUCIATE LIGAMENT REPAIR Right 07/12/2022   APPENDECTOMY     KNEE ARTHROSCOPY W/ LATERAL RELEASE Left 08/22/1995    Family History  Problem Relation Age of Onset   Ovarian cancer Mother    Breast cancer Paternal Aunt 66   Ovarian cancer Maternal Grandmother    Stroke Maternal Grandfather    Diabetes Maternal Grandfather    Breast cancer Cousin 29       paternal 1st cousin    Social History:  reports that she has never smoked. She has never been exposed to tobacco smoke. She has never used smokeless tobacco. She reports current alcohol use. She reports that she does not use drugs.  Allergies:  Allergies  Allergen Reactions   Codeine Nausea And Vomiting    Prior to Admission medications   Medication Sig Start Date End Date Taking? Authorizing Provider  sertraline (ZOLOFT) 100 MG tablet TAKE 1 TABLET BY MOUTH DAILY Patient taking differently: Take 50 mg by mouth daily. TAKE 1 TABLET BY MOUTH DAILY 09/14/22  Yes Copland, Gwenlyn Found, MD  ALPRAZolam  (XANAX) 0.25 MG tablet TAKE 1 TABLET(0.25 MG) BY MOUTH TWICE DAILY AS NEEDED FOR ANXIETY 01/24/23   Copland, Gwenlyn Found, MD  ibuprofen (ADVIL) 600 MG tablet Take by mouth. Patient not taking: Reported on 09/10/2023 07/12/22   [provider]    Blood pressure 111/72, pulse 62, height 5\' 4"  (1.626 m), weight 122 lb (55.3 kg), SpO2 98%. Exam: General: Communicates without difficulty, well nourished, no acute distress. Head: Normocephalic, no evidence injury, no tenderness, facial buttresses intact without stepoff. Face/sinus: No tenderness to palpation and percussion. Facial movement is normal and symmetric. Eyes: PERRL, EOMI. No scleral icterus, conjunctivae clear. Neuro: CN II exam reveals vision grossly intact.  No nystagmus at any point of gaze. Ears: Auricles well formed without lesions.  Ear canals are intact without mass or lesion.  No erythema or edema is appreciated.  The TMs are intact without fluid. Nose: External evaluation reveals normal support and skin without lesions.  Dorsum is intact.  Anterior rhinoscopy reveals congested mucosa over anterior aspect of inferior turbinates and intact septum.  No purulence noted. Oral:  Oral cavity and oropharynx are intact, symmetric, without erythema or edema.  Mucosa is moist without lesions. Neck: Full range of motion without pain.  There is no significant lymphadenopathy.  No masses palpable.  Thyroid bed within normal limits to palpation.  Parotid glands and submandibular glands equal bilaterally without mass.  Trachea is midline. Neuro:  CN 2-12 grossly intact. Vestibular: No nystagmus at any point of gaze. Dix Hallpike negative.  Vestibular: There is no nystagmus with pneumatic pressure on either tympanic membrane or Valsalva. The cerebellar examination is unremarkable.    Her hearing test is normal bilaterally across all frequencies.  Assessment: 1.  Her symptom complex of recurrent dizziness, left ear pressure, and left ear tinnitus is  suggestive of left ear Mnire's disease. 2.  Other possible differential diagnoses include transient BPPV, vestibular migraine, peripheral vestibular dysfunction, or other central/systemic causes.   3.  Her ear canals, tympanic membranes, and middle ear spaces are normal.  No middle ear effusion or infection is noted.  Her Dix-Hallpike maneuver is negative.  Plan: 1.  The physical exam findings and hearing test results are reviewed with the patient. 2.  The pathophysiology of vestibular dysfunction, Mnire's disease, and dizziness are discussed extensively with the patient. The possible differential diagnoses are reviewed. Questions are invited and answered.   3.  The patient is encouraged to start a 1500 mg low-salt diet.  Other treatment options for Mnire's disease are also discussed. 4.  The patient will return for reevaluation in 3 months.  Jafar Poffenberger W Julien Oscar 09/11/2023, 11:31 AM

## 2023-09-11 NOTE — Progress Notes (Signed)
  [address], [address] [place], [place] [address] [phone]  Audiological Evaluation    Name: Kelly Stanley     DOB:   1976-02-04      MRN:   [id number]                                                                                     Service Date: 09/11/2023     Accompanied by: unaccompanied   Patient comes today after Dr. Suszanne Conners, ENT sent a referral for a hearing evaluation due to concerns with dizziness.   Symptoms Yes Details  Hearing loss  []    Tinnitus  [x]  Tinnitus in both ears since October 2024, comes and goes and it sounds like "metal".  Ear pain/ Ear infections  []    Balance problems  [x]  Off balance described as lightheaded or heavy headed with no identified trigger.  Noise exposure  []    Previous ear surgeries  []    Family history  []    Amplification  []    Other  [x]  Balance issues may have started after reducing Zoloft dose. She went up in dosis with Zoloft and seems like the dizziness has improved. Also progesterone was recommended for her during that time.    Otoscopy: Right ear: Clear external ear canals and notable landmarks visualized on the tympanic membrane. Left ear:  Clear external ear canals and notable landmarks visualized on the tympanic membrane.  Tympanometry: Right ear: Type A- Normal external ear canal volume with normal middle ear pressure and tympanic membrane compliance Left ear: Type A- Normal external ear canal volume with normal middle ear pressure and tympanic membrane compliance  Pure tone Audiometry: Right ear- Normal hearing from [phone] Hz, then a mild presumably sensorineural hearing loss at [facility].    Left ear-  Normal hearing from [phone] Hz, then a moderate presumably sensorineural hearing loss at [facility].    The hearing test results were completed under headphones and results are deemed to be of good reliability. Test technique:  conventional     Speech Audiometry: Right ear- Speech Reception Threshold (SRT) was  obtained at 10 dBHL Left ear-Speech Reception Threshold (SRT) was obtained at 10 dBHL   Word Recognition Score Tested using NU-6 (MLV) Right ear: 96% was obtained at a presentation level of 55 dBHL with contralateral masking which is deemed as  excellent Left ear: 96% was obtained at a presentation level of 55 dBHL with contralateral masking which is deemed as  excellent   Recommendations: Follow up with ENT as scheduled for today. Return for a hearing evaluation if concerns with hearing changes arise or per MD recommendation. Use hearing protection when exposed to loud/damaging sounds. Consider various tinnitus strategies, including the use of a sound generator, and/or tinnitus retraining therapy. Patient may consider making an appointment at [facility]'s tinnitus clinic.    Onya Eutsler MARIE LEROUX-MARTINEZ, AUD

## 2023-10-16 ENCOUNTER — Ambulatory Visit (INDEPENDENT_AMBULATORY_CARE_PROVIDER_SITE_OTHER): Payer: Commercial Managed Care - PPO | Admitting: Nurse Practitioner

## 2023-10-16 VITALS — BP 106/68 | HR 77 | Ht 64.0 in | Wt 126.0 lb

## 2023-10-16 DIAGNOSIS — Z01419 Encounter for gynecological examination (general) (routine) without abnormal findings: Secondary | ICD-10-CM

## 2023-10-16 NOTE — Progress Notes (Signed)
 Kelly Stanley [birth year] [id number]   History:  48 y.o. [id number] presents for annual exam. Monthly cycles. Stopped OCPs, uses condoms most of the time. LGSIL 2005-2007, negative colposcopy at that time, normal pap smears since then. Anxiety managed by PCP, slowly weaning Zoloft. Taking Prometrium, prescribed by [facility].   Gynecologic History Patient's last menstrual period was 09/21/2023. Period Cycle (Days): 28 Period Duration (Days): 5 Period Pattern: Regular Menstrual Flow: Moderate Menstrual Control: Tampon Menstrual Control Change Freq (Hours): 3-4 Dysmenorrhea: (!) Mild Dysmenorrhea Symptoms: Cramping, Nausea, Diarrhea, Headache Contraception/Family planning: condoms Sexually active: Yes  Health Maintenance Last Pap: 09/20/2022. Results were: ASCUS neg HPV, 3-year repeat Last mammogram: 01/02/2023. Results were: Normal Last colonoscopy: 2010. 10/2022 neg Cologuard Last Dexa: Not indicated Exercising: Yes. Cardio and strength training Smoker: no   Past medical history, past surgical history, family history and social history were all reviewed and documented in the EPIC chart. Married. 60 yo son, 64 yo daughter. 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 Mother and MGM with history of ovarian cancer, negative genetic testing.   ROS:  A ROS was performed and pertinent positives and negatives are included.  Exam:  Vitals:   10/16/23 1041  BP: 106/68  Pulse: 77  SpO2: 100%  Weight: 126 lb (57.2 kg)  Height: 5\' 4"  (1.626 m)     Body mass index is 21.63 kg/m.  General appearance:  Normal Thyroid:  Symmetrical, normal in size, without palpable masses or nodularity. Respiratory  Auscultation:  Clear without wheezing or rhonchi Cardiovascular  Auscultation:  Regular rate, without rubs, murmurs or gallops  Edema/varicosities:  Not grossly evident Abdominal  Soft,nontender, without masses, guarding or rebound.  Liver/spleen:  No  organomegaly noted  Hernia:  None appreciated  Skin  Inspection:  Grossly normal Breasts: Examined lying and sitting.   Right: Without masses, retractions, nipple discharge or axillary adenopathy.   Left: Without masses, retractions, nipple discharge or axillary adenopathy. Pelvic: External genitalia:  no lesions              Urethra:  normal appearing urethra with no masses, tenderness or lesions              Bartholins and Skenes: normal                 Vagina: normal appearing vagina with normal color and discharge, no lesions              Cervix: no lesions Bimanual Exam:  Uterus:  no masses or tenderness              Adnexa: no mass, fullness, tenderness              Rectovaginal: Deferred              Anus:  normal, no lesions  Patient informed chaperone available to be present for breast and pelvic exam. Patient has requested no chaperone to be present. Patient has been advised what will be completed during breast and pelvic exam.   Assessment/Plan:  48 y.o. [id number] for annual exam.   Well female exam with routine gynecological exam - Education provided on SBEs, importance of preventative screenings, current guidelines, high calcium diet, regular exercise, and multivitamin daily.  Labs with PCP.   Screening for colon cancer - Negative Cologuard 10/2022  Screening for cervical cancer -  LGSIL 2005-2007, negative colposcopy at that time. 08/2022 ASCUS neg HPV. Will repeat at 3-year interval per guidelines.  Screening for breast cancer - Normal mammogram history.  Continue annual screenings.  Normal breast exam today.  Return in about 1 year (around 10/15/2024) for Annual.      Olivia Mackie DNP, 11:15 AM 10/16/2023

## 2023-12-10 ENCOUNTER — Other Ambulatory Visit: Payer: Self-pay | Admitting: Family Medicine

## 2023-12-10 DIAGNOSIS — Z1231 Encounter for screening mammogram for malignant neoplasm of breast: Secondary | ICD-10-CM

## 2023-12-27 ENCOUNTER — Encounter (HOSPITAL_COMMUNITY): Payer: Self-pay

## 2024-01-03 ENCOUNTER — Ambulatory Visit (INDEPENDENT_AMBULATORY_CARE_PROVIDER_SITE_OTHER): Payer: Commercial Managed Care - PPO | Admitting: Otolaryngology

## 2024-01-03 ENCOUNTER — Ambulatory Visit

## 2024-01-10 ENCOUNTER — Ambulatory Visit

## 2024-03-31 ENCOUNTER — Ambulatory Visit: Payer: Self-pay | Admitting: Nurse Practitioner

## 2024-03-31 VITALS — BP 118/70 | HR 66 | Ht 63.5 in | Wt 125.0 lb

## 2024-03-31 DIAGNOSIS — Z0000 Encounter for general adult medical examination without abnormal findings: Secondary | ICD-10-CM

## 2024-03-31 DIAGNOSIS — R92343 Mammographic extreme density, bilateral breasts: Secondary | ICD-10-CM

## 2024-03-31 DIAGNOSIS — R9230 Dense breasts, unspecified: Secondary | ICD-10-CM | POA: Diagnosis not present

## 2024-03-31 NOTE — Progress Notes (Signed)
   Acute Office Visit  Subjective:    Patient ID: Kelly Stanley, female    DOB: 1976-08-18, 48 y.o.   MRN: [id number]   HPI 48 y.o. presents today for right breast lump about a month ago. Does not feel it anymore. It was mobile, slightly painful and under areola. Her Scan 02/19/24 - normal. Most recent mammogram 01/02/2023, normal, cat d breast density. Paternal aunt (age 48s) and paternal cousin (age 2) with history of breast cancer.   Patient's last menstrual period was 03/13/2024 (approximate). Period Cycle (Days): 28 Period Duration (Days): 8 Period Pattern: Regular Menstrual Flow: Moderate Menstrual Control: Tampon Menstrual Control Change Freq (Hours): 3-4 Dysmenorrhea: (!) Mild Dysmenorrhea Symptoms: Cramping, Nausea  Review of Systems  Constitutional: Negative.   Hematological:  Negative for adenopathy.  Right breast: + mass (resolved). Negative for pain, nipple discharge, skin changes.  Left breast: Negative.     Objective:    Physical Exam Chest:  Breasts:    Right: No swelling, inverted nipple, mass, nipple discharge, skin change or tenderness.     Left: No swelling, inverted nipple, mass, nipple discharge, skin change or tenderness.  Lymphadenopathy:     Upper Body:     Right upper body: No supraclavicular or axillary adenopathy.     Left upper body: No supraclavicular or axillary adenopathy.     BP 118/70 (BP Location: Right Arm, Patient Position: Sitting, Cuff Size: Normal)   Pulse 66   Ht 5' 3.5 (1.613 m)   Wt 125 lb (56.7 kg)   LMP 03/13/2024 (Approximate)   SpO2 99%   BMI 21.80 kg/m  Wt Readings from Last 3 Encounters:  03/31/24 125 lb (56.7 kg)  10/16/23 126 lb (57.2 kg)  09/10/23 122 lb (55.3 kg)        Assessment & Plan:   Problem List Items Addressed This Visit   None Visit Diagnoses       Extremely dense tissue of both breasts on mammography    -  Primary     Normal breast exam          Plan: Normal breast exam today. Reviewed  Her Scan results. Will monitor.   Return if symptoms worsen or fail to improve.      Kelly DELENA Shutter DNP, 3:11 PM 03/31/2024

## 2024-07-31 ENCOUNTER — Other Ambulatory Visit: Payer: Self-pay | Admitting: Nurse Practitioner

## 2024-07-31 ENCOUNTER — Encounter: Payer: Self-pay | Admitting: Nurse Practitioner

## 2024-07-31 DIAGNOSIS — N926 Irregular menstruation, unspecified: Secondary | ICD-10-CM

## 2024-08-01 ENCOUNTER — Ambulatory Visit

## 2024-08-01 ENCOUNTER — Telehealth: Payer: Self-pay

## 2024-08-01 NOTE — Telephone Encounter (Signed)
 Spoke to patient regarding HCG, following up phone triage message yesterday.  Patient scheduled for lab appointment this morning.

## 2024-08-01 NOTE — Telephone Encounter (Signed)
 Spoke to patient regarding lab results (elevated HCG Quant 30 and Estradiol  322--07/30/24).  Patient states she had repeat quant drawn at [facility] this morning (9:30 am).  Patient had labs ordered through [facility] NP @ [facility].  Patient is currently taking compounded progesterone 100 mg at night.  (She takes compounded progesterone 50 mg starting on first day period ends to day 16 and then increases to 100 mg until her next period starts).  Patient states she spoke to [facility] NP this morning and she told her not to take progesterone 100 mg tonight, due to elevated HCG.  Patient states she started light spotting this morning.  Patient denies any pelvic pain or cramping.  BC: none.  Patient has follow up appointment next week with [name] Shutter NP.  Patient wants to know if she should stop progesterone (as recommended by [facility] NP).  Patient informed Tiffany NP was out of the office today and I would check with one of the other provider's in the office.  Patient verbalized understanding and agreed.  Chart sent to Provider for review and recommendations.

## 2024-08-08 ENCOUNTER — Encounter: Payer: Self-pay | Admitting: Nurse Practitioner

## 2024-08-08 ENCOUNTER — Ambulatory Visit: Admitting: Nurse Practitioner

## 2024-08-08 VITALS — BP 116/70 | HR 72

## 2024-08-08 DIAGNOSIS — Z30019 Encounter for initial prescription of contraceptives, unspecified: Secondary | ICD-10-CM

## 2024-08-08 DIAGNOSIS — N926 Irregular menstruation, unspecified: Secondary | ICD-10-CM | POA: Diagnosis not present

## 2024-08-08 DIAGNOSIS — O039 Complete or unspecified spontaneous abortion without complication: Secondary | ICD-10-CM

## 2024-08-08 MED ORDER — PHEXXI 1.8-1-0.4 % VA GEL: VAGINAL | 5 refills | Status: AC

## 2024-08-08 NOTE — Progress Notes (Signed)
" ° °  Acute Office Visit  Subjective:    Patient ID: Kelly Stanley, female    DOB: [birth year], 48 y.o.   MRN: [id number]   HPI 48 y.o. presents today for follow up. Had hcg level of 30 07/30/2024 (completed elsewhere) after missing menses. LMP 06/25/2024. Recheck 12/12 was 25 and bleeding began that day. Bleeding is very light now and pregnancy symptoms have subsided. Has stopped Prometrium during this time. Husband planning vasectomy in January. Not interested in hormonal contraception.   Patient's last menstrual period was 08/01/2024 (exact date).    Review of Systems  Constitutional: Negative.   Genitourinary:  Positive for vaginal bleeding.       Objective:    Physical Exam Constitutional:      Appearance: Normal appearance.     BP 116/70   Pulse 72   LMP 08/01/2024 (Exact Date)   SpO2 98%  Wt Readings from Last 3 Encounters:  03/31/24 125 lb (56.7 kg)  10/16/23 126 lb (57.2 kg)  09/10/23 122 lb (55.3 kg)       Assessment & Plan:   Problem List Items Addressed This Visit   None Visit Diagnoses       Miscarriage    -  Primary     Encounter for initial prescription of contraceptives, unspecified contraceptive       Relevant Medications   Lactic Ac-Citric Ac-Pot Bitart (PHEXXI) 1.8-1-0.4 % GEL      Plan: Recheck Hcg today. OK to restart Prometrium. Phexxi - Insert 1 applicatorful vaginally immediately before or up to 1 hour before EACH act of vaginal intercourse as needed.   Return if symptoms worsen or fail to improve.    Kelly DELENA Shutter DNP, 10:02 AM 08/08/2024 "

## 2024-08-09 LAB — HCG, QUANTITATIVE, PREGNANCY: HCG, Total, QN: <5 m[IU]/mL

## 2024-08-11 ENCOUNTER — Ambulatory Visit: Payer: Self-pay | Admitting: Radiology

## 2024-08-12 ENCOUNTER — Telehealth: Payer: Self-pay

## 2024-08-12 NOTE — Telephone Encounter (Signed)
 PA done for Phexxi . Key: [id number] Patient aware it could take several days for a decision.

## 2024-08-19 NOTE — Telephone Encounter (Signed)
 Detailed message left for patient letting her know the Phexxi  was approved and to call us  back if she has any questions. DPR signed.
